# Patient Record
Sex: Male | Born: 1935 | Race: White | Hispanic: No | Marital: Married | Smoking: Former smoker
Health system: Southern US, Community
[De-identification: ages and names within clinical notes are randomized; demographics above are authoritative.]

## PROBLEM LIST (undated history)

## (undated) DIAGNOSIS — M1990 Unspecified osteoarthritis, unspecified site: Secondary | ICD-10-CM

## (undated) DIAGNOSIS — Z923 Personal history of irradiation: Secondary | ICD-10-CM

## (undated) DIAGNOSIS — H02439 Paralytic ptosis unspecified eyelid: Secondary | ICD-10-CM

## (undated) DIAGNOSIS — G510 Bell's palsy: Secondary | ICD-10-CM

## (undated) DIAGNOSIS — I10 Essential (primary) hypertension: Secondary | ICD-10-CM

## (undated) DIAGNOSIS — M542 Cervicalgia: Secondary | ICD-10-CM

## (undated) DIAGNOSIS — IMO0002 Reserved for concepts with insufficient information to code with codable children: Secondary | ICD-10-CM

## (undated) DIAGNOSIS — N189 Chronic kidney disease, unspecified: Secondary | ICD-10-CM

## (undated) HISTORY — PX: OTHER SURGICAL HISTORY: SHX169

---

## 1997-03-19 HISTORY — PX: BACK SURGERY: SHX140

## 1999-01-02 ENCOUNTER — Other Ambulatory Visit: Admission: RE | Admit: 1999-01-02 | Discharge: 1999-01-02 | Payer: Self-pay | Admitting: Urology

## 2012-07-16 ENCOUNTER — Ambulatory Visit: Payer: Self-pay | Admitting: Otolaryngology

## 2012-07-16 LAB — CBC WITH DIFFERENTIAL/PLATELET
Basophil #: 0.1 10*3/uL (ref 0.0–0.1)
Basophil %: 1.1 %
Eosinophil %: 4.1 %
HCT: 41.5 % (ref 40.0–52.0)
Lymphocyte #: 1.8 10*3/uL (ref 1.0–3.6)
Lymphocyte %: 24.3 %
MCH: 32.5 pg (ref 26.0–34.0)
MCHC: 35 g/dL (ref 32.0–36.0)
MCV: 93 fL (ref 80–100)
Monocyte #: 0.6 x10 3/mm (ref 0.2–1.0)
Platelet: 218 10*3/uL (ref 150–440)
RDW: 13.5 % (ref 11.5–14.5)

## 2012-07-16 LAB — BASIC METABOLIC PANEL
Anion Gap: 5 — ABNORMAL LOW (ref 7–16)
Chloride: 106 mmol/L (ref 98–107)
Co2: 29 mmol/L (ref 21–32)
EGFR (African American): >60
EGFR (Non-African Amer.): 57 — ABNORMAL LOW
Osmolality: 283 (ref 275–301)
Potassium: 3.9 mmol/L (ref 3.5–5.1)

## 2012-07-17 ENCOUNTER — Ambulatory Visit: Payer: Self-pay | Admitting: Radiation Oncology

## 2012-07-17 ENCOUNTER — Ambulatory Visit: Payer: Self-pay | Admitting: Otolaryngology

## 2012-07-17 HISTORY — PX: OTHER SURGICAL HISTORY: SHX169

## 2012-07-23 ENCOUNTER — Ambulatory Visit: Payer: Self-pay | Admitting: Radiation Oncology

## 2012-08-05 LAB — CBC CANCER CENTER
Basophil #: 0.1 x10 3/mm (ref 0.0–0.1)
Basophil %: 1.1 %
Eosinophil #: 0.3 x10 3/mm (ref 0.0–0.7)
Eosinophil %: 4.5 %
HCT: 40.9 % (ref 40.0–52.0)
Lymphocyte #: 1.4 x10 3/mm (ref 1.0–3.6)
Lymphocyte %: 21.2 %
MCH: 31.9 pg (ref 26.0–34.0)
MCV: 94 fL (ref 80–100)
Monocyte %: 7.6 %
Neutrophil #: 4.3 x10 3/mm (ref 1.4–6.5)
Neutrophil %: 65.6 %
WBC: 6.6 x10 3/mm (ref 3.8–10.6)

## 2012-08-12 LAB — CBC CANCER CENTER
Basophil %: 1.1 %
HCT: 40.2 % (ref 40.0–52.0)
Lymphocyte #: 1.3 x10 3/mm (ref 1.0–3.6)
Lymphocyte %: 18.3 %
MCHC: 34.6 g/dL (ref 32.0–36.0)
MCV: 93 fL (ref 80–100)
Monocyte #: 0.5 x10 3/mm (ref 0.2–1.0)
Monocyte %: 7.3 %
Neutrophil #: 5 x10 3/mm (ref 1.4–6.5)
Neutrophil %: 69.4 %
Platelet: 230 x10 3/mm (ref 150–440)
RDW: 13.7 % (ref 11.5–14.5)
WBC: 7.2 x10 3/mm (ref 3.8–10.6)

## 2012-08-17 ENCOUNTER — Ambulatory Visit: Payer: Self-pay | Admitting: Radiation Oncology

## 2012-08-19 LAB — CBC CANCER CENTER
Basophil #: 0.1 x10 3/mm (ref 0.0–0.1)
Basophil %: 1.2 %
Eosinophil %: 2.7 %
HCT: 39.9 % — ABNORMAL LOW (ref 40.0–52.0)
HGB: 13.9 g/dL (ref 13.0–18.0)
Lymphocyte %: 17.3 %
MCV: 93 fL (ref 80–100)
Monocyte #: 0.7 x10 3/mm (ref 0.2–1.0)
Neutrophil #: 5.2 x10 3/mm (ref 1.4–6.5)
Platelet: 217 x10 3/mm (ref 150–440)
RBC: 4.29 10*6/uL — ABNORMAL LOW (ref 4.40–5.90)
RDW: 13.7 % (ref 11.5–14.5)
WBC: 7.5 x10 3/mm (ref 3.8–10.6)

## 2012-08-26 LAB — CBC CANCER CENTER
Basophil %: 0.3 %
Eosinophil #: 0.1 x10 3/mm (ref 0.0–0.7)
Lymphocyte #: 1.2 x10 3/mm (ref 1.0–3.6)
Lymphocyte %: 12.1 %
MCH: 32.2 pg (ref 26.0–34.0)
MCV: 93 fL (ref 80–100)
Monocyte #: 0.7 x10 3/mm (ref 0.2–1.0)
Monocyte %: 7.2 %
Neutrophil %: 79.7 %
Platelet: 250 x10 3/mm (ref 150–440)
RBC: 4.33 10*6/uL — ABNORMAL LOW (ref 4.40–5.90)
RDW: 13.4 % (ref 11.5–14.5)

## 2012-09-02 ENCOUNTER — Ambulatory Visit: Payer: Self-pay | Admitting: Otolaryngology

## 2012-09-16 ENCOUNTER — Ambulatory Visit: Payer: Self-pay | Admitting: Radiation Oncology

## 2012-09-25 ENCOUNTER — Ambulatory Visit: Payer: Self-pay | Admitting: Otolaryngology

## 2012-09-25 LAB — CREATININE, SERUM
Creatinine: 1.26 mg/dL (ref 0.60–1.30)
EGFR (African American): >60
EGFR (Non-African Amer.): 54 — ABNORMAL LOW

## 2012-11-20 ENCOUNTER — Ambulatory Visit: Payer: Self-pay | Admitting: Otolaryngology

## 2012-12-29 ENCOUNTER — Ambulatory Visit: Payer: Self-pay | Admitting: Otolaryngology

## 2013-03-17 ENCOUNTER — Ambulatory Visit: Payer: Self-pay | Admitting: Physical Medicine and Rehabilitation

## 2013-04-22 ENCOUNTER — Ambulatory Visit: Payer: Self-pay | Admitting: Otolaryngology

## 2013-04-23 ENCOUNTER — Ambulatory Visit: Payer: Self-pay | Admitting: Otolaryngology

## 2013-04-23 DIAGNOSIS — IMO0002 Reserved for concepts with insufficient information to code with codable children: Secondary | ICD-10-CM | POA: Insufficient documentation

## 2013-04-23 HISTORY — DX: Reserved for concepts with insufficient information to code with codable children: IMO0002

## 2013-04-24 LAB — PATHOLOGY REPORT

## 2013-04-27 ENCOUNTER — Ambulatory Visit: Payer: Self-pay | Admitting: Oncology

## 2013-05-04 ENCOUNTER — Ambulatory Visit: Payer: Self-pay | Admitting: Oncology

## 2013-05-11 ENCOUNTER — Telehealth: Payer: Self-pay | Admitting: *Deleted

## 2013-05-11 ENCOUNTER — Encounter: Payer: Self-pay | Admitting: Radiation Oncology

## 2013-05-11 DIAGNOSIS — Z923 Personal history of irradiation: Secondary | ICD-10-CM | POA: Insufficient documentation

## 2013-05-11 NOTE — Progress Notes (Signed)
Head and Neck Cancer Location of Tumor / Histology: right lower neck mass  Patient presented 7 months ago with symptoms of: pain in right ear, disruptive at night  Biopsies of right lower neck mass (if applicable) revealed: squamous cell carcinoma  Nutrition Status:  Weight changes: reports a 15-20 lb weight loss over the last 3-4 months  Swallowing status: Report difficulty swallowing related to dry mouth  Plans, if any, for PEG tube:   Tobacco/Marijuana/Snuff/ETOH use: Former smoker but, quit around 1988  Past/Anticipated interventions by otolaryngology, if any:   Past/Anticipated interventions by medical oncology, if any:   Referrals yet, to any of the following?  Social Work? no  Dentistry? no  Swallowing therapy? no  Nutrition? no  Med/Onc? no  PEG placement? no  SAFETY ISSUES:  Prior radiation? YES  07/28/12- 09/03/12, right cheek, 5000 cGy 25 fractions, [place], Dr Noreene Filbert  Pacemaker/ICD?   Possible current pregnancy? na  Is the patient on methotrexate? no  Current Complaints / other details:  Reports an ache behind his right ear managed with gabapentin and hydrocodone, reports dry mouth, decreased appetite, denies ringing in the ears, or sleeping well with assistance of pain medication

## 2013-05-11 NOTE — Telephone Encounter (Signed)
Called pt at home to introduce myself as the nurse navigator that works with Dr. Valere Dross.  LVM with indication that I would be joining him during his appt with Dr. Valere Dross tomorrow and would tell him more  about my role as a member of the Care Team when we meet.   Gayleen Orem, RN, BSN, [facility] Head & Neck [facility] Navigator [phone]

## 2013-05-12 ENCOUNTER — Encounter: Payer: Self-pay | Admitting: Radiation Oncology

## 2013-05-12 ENCOUNTER — Encounter: Payer: Self-pay | Admitting: *Deleted

## 2013-05-12 ENCOUNTER — Ambulatory Visit
Admission: RE | Admit: 2013-05-12 | Discharge: 2013-05-12 | Disposition: A | Discharge status: Home / Self Care | Payer: Medicare Other | Source: Ambulatory Visit | Attending: Radiation Oncology | Admitting: Radiation Oncology

## 2013-05-12 VITALS — BP 128/79 | HR 75 | Temp 97.5°F | Resp 16 | Ht 69.0 in | Wt 199.4 lb

## 2013-05-12 DIAGNOSIS — G510 Bell's palsy: Secondary | ICD-10-CM | POA: Insufficient documentation

## 2013-05-12 DIAGNOSIS — N189 Chronic kidney disease, unspecified: Secondary | ICD-10-CM | POA: Insufficient documentation

## 2013-05-12 DIAGNOSIS — C4442 Squamous cell carcinoma of skin of scalp and neck: Secondary | ICD-10-CM | POA: Insufficient documentation

## 2013-05-12 DIAGNOSIS — Z79899 Other long term (current) drug therapy: Secondary | ICD-10-CM | POA: Insufficient documentation

## 2013-05-12 DIAGNOSIS — Z87891 Personal history of nicotine dependence: Secondary | ICD-10-CM | POA: Insufficient documentation

## 2013-05-12 DIAGNOSIS — IMO0002 Reserved for concepts with insufficient information to code with codable children: Secondary | ICD-10-CM

## 2013-05-12 DIAGNOSIS — Z923 Personal history of irradiation: Secondary | ICD-10-CM | POA: Insufficient documentation

## 2013-05-12 DIAGNOSIS — I129 Hypertensive chronic kidney disease with stage 1 through stage 4 chronic kidney disease, or unspecified chronic kidney disease: Secondary | ICD-10-CM | POA: Insufficient documentation

## 2013-05-12 HISTORY — DX: Personal history of irradiation: Z92.3

## 2013-05-12 HISTORY — DX: Chronic kidney disease, unspecified: N18.9

## 2013-05-12 HISTORY — DX: Reserved for concepts with insufficient information to code with codable children: IMO0002

## 2013-05-12 HISTORY — DX: Bell's palsy: G51.0

## 2013-05-12 HISTORY — DX: Paralytic ptosis unspecified eyelid: H02.439

## 2013-05-12 HISTORY — DX: Cervicalgia: M54.2

## 2013-05-12 HISTORY — DX: Unspecified osteoarthritis, unspecified site: M19.90

## 2013-05-12 HISTORY — DX: Essential (primary) hypertension: I10

## 2013-05-12 NOTE — Progress Notes (Addendum)
[facility] NEW PATIENT EVALUATION  Name: Wyatt [name] MRN: [id number]  Date:   05/12/2013           DOB: March 18, 1931  Status: outpatient   CC: No primary provider on file.  Norva Pavlov, MD,  Dr. Delight Hoh [phone]), Dr. Noreene Filbert, Dr. Jorja Loa   REFERRING PHYSICIAN: Norva Pavlov, MD   DIAGNOSIS: Recurrent squamous cell carcinoma, initial skin primary  HISTORY OF PRESENT ILLNESS:  Wyatt [name] is a 80 y.o. male who is seen today through the courtesy of Dr. Janalee Dane for consideration of palliative radiation therapy in the management of his recurrent squamous cell carcinoma the skin involving the right neck and facial nerve. His complete medical records are not available the time this dictation. He is a poor historian. He believes that he had a skin cancer excised along the right jaw region by Dr. Sarajane Jews early last year. On followup examination he was felt to have for fullness along the right submandibular region and was referred to Dr. Carlis Abbott. A needle aspiration biopsy was negative. A CT scan in April 2014 showed a 1.1 cm subcutaneous nodule anterior and inferior to the inferior portion of the right parotid gland. The patient did agree to a wide local excision. He was found to have a 1.5 cm well-differentiated keratinizing squamous cell carcinoma with a close (2 mm) margin. There was no LV I. One level II lymph node was examined and there was no evidence for metastatic disease. In view of his close margins he underwent electron beam radiation therapy with Dr. Baruch Gouty in [place]. His summary note indicates that he receive 5000 cGy in 25 sessions with 15 MEV electrons. Of note is that 75% of the way through his radiation therapy he developed right facial nerve paralysis and Dr. Carlis Abbott was concerned about facial nerve involvement. The patient was offered surgery but he declined. Subsequently, he developed a mass along the right  level IV region which was biopsied and found to be diagnostic for squamous cell carcinoma. A PET scan at [facility] on 05/04/2013 showed a 1.6 cm right level IV node with SUV of 7.6. There was also a left paraspinal muscle lesion along the left side of C3-C4 (but the body of the report stated L3-L4 which I believe is a typo). Of note is that he had a right-sided epidural cervical spine injection for arthritis 3 weeks ago with no  improvement of his neck pain. He had a similar injection in the past with favorable results. He was seen at [facility] where he apparently had a recent MRI scan apparently suggesting facial nerve involvement. This report and scan are not available the time of this dictation. He was seen by Dr. Carlis Abbott last week he felt that he may be a candidate for palliative radiation therapy. The patient wants to be treated closer to home here in [place]. We spoke to one another yesterday. Dr. Delight Hoh saw the patient and suggests concomitant weekly cisplatin/carboplatin or Erbitux along with radiation therapy. The patient takes hydrocodone and also gabapentin for right-sided neck pain. He has lost approximately 15-20 pounds over the past 4 months and he attributes this to hydrocodone with a decrease in his appetite. The patient plans on getting married on March 21 and would like to start his radiation therapy the week of March 23.  PREVIOUS RADIATION THERAPY: Status post 5000 cGy in 25 sessions completed on 09/03/2012 to the "  right cheek "   PAST MEDICAL HISTORY:  has a past medical history of Bell's palsy; Hypertension; Chronic kidney disease; Squamous cell carcinoma (04/23/13); Arthritis; Cancer (04/23/13); Paralytic ptosis; Cervicalgia; and radiation therapy (07/28/12- 09/03/12).     PAST SURGICAL HISTORY:  Past Surgical History  Procedure Laterality Date  . Eyelid surgery Right     platinum weight placement/removal  . Resection right cheek mass Right 07/17/2012  .  Back surgery  1999     FAMILY HISTORY: His father died of old age at 50. His mother died in her mid to late 62s, unknown cause.  SOCIAL HISTORY:  reports that he quit smoking about 27 years ago. His smoking use included Cigarettes. He smoked 0.50 packs per day. He has never used smokeless tobacco. He reports that he drinks alcohol. He reports that he does not use illicit drugs. widowed, one adopted son. He plans on getting married on March 21. 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 He is been retired for 6-7 years.   ALLERGIES: Review of patient's allergies indicates no known allergies.   MEDICATIONS:  Current Outpatient Prescriptions  Medication Sig Dispense Refill  . donepezil (ARICEPT) 10 MG tablet Take 10 mg by mouth at bedtime.      Marland Kitchen ezetimibe-simvastatin (VYTORIN) 10-10 MG per tablet Take 1 tablet by mouth at bedtime.      Marland Kitchen HYDROcodone-acetaminophen (NORCO/VICODIN) 5-325 MG per tablet Take 1 tablet by mouth every 6 (six) hours as needed for moderate pain.      . hydroquinone 4 % cream Apply topically 2 (two) times daily.      . lansoprazole (PREVACID) 15 MG capsule Take 15 mg by mouth daily at 12 noon.      . levETIRAcetam (KEPPRA) 1000 MG tablet Take 1,000 mg by mouth daily.      . memantine (NAMENDA) 10 MG tablet Take 10 mg by mouth daily.      . metoprolol succinate (TOPROL-XL) 100 MG 24 hr tablet Take 100 mg by mouth daily. Take with or immediately following a meal.      . aspirin 81 MG tablet Take 81 mg by mouth daily.      Marland Kitchen doxycycline (VIBRAMYCIN) 100 MG capsule Take 100 mg by mouth daily.      . tamsulosin (FLOMAX) 0.4 MG CAPS capsule Take 0.4 mg by mouth.       No current facility-administered medications for this encounter.     REVIEW OF SYSTEMS:  Pertinent items are noted in HPI.    PHYSICAL EXAM:  height is 5\' 9"  (1.753 m) and weight is 199 lb 6.4 oz (90.447 kg). His oral temperature is 97.5 F (36.4 C). His blood pressure is 128/79  and his pulse is 75. His respiration is 16 and oxygen saturation is 100%.   Alert and oriented 80 year-old white male with obvious right facial weakness appearing his stated age. Head and neck examination: There is right facial paralysis. Oral cavity and oropharynx are unremarkable to inspection. He has numerous dental restorations. His mouth is moist. Neck: There is a surgical scar standing from the right posttreatment region and down to the right lower neck. There is hair loss extending superiorly to the angle of the jaw line and extending inferiorly to portions of level II and level III. There is a palpable subcutaneous/nodal mass along the right lower neck (level IV) which is fixed to the overlying skin. There is slight nodularity to his right neck  scar but no definite recurrence elsewhere within the neck. Cranial nerve examination remarkable for right peripheral seventh nerve paralysis.   LABORATORY DATA:  No results found for this basename: WBC, HGB, HCT, MCV, PLT   No results found for this basename: NA, K, CL, CO2   No results found for this basename: ALT, AST, GGT, ALKPHOS, BILITOT      IMPRESSION: Recurrent squamous cell carcinoma of the skin involving the lower right neck, and presumably the right facial nerve/temporal region. I need to review all of his outside studies including his recent MRI scan from [facility]. I need to review his radiation therapy fields, and I spoke with Dr. Baruch Gouty this morning. With respect to his PET scan findings from February 16, I wonder if we are seeing uptake related to his recent epidural injection, although his injection was on the right side of the cervical spine. I explained to the patient and his fiance that we can offer him palliative radiation therapy from the right temporal region down through the right lower neck, but reduce the dose to the previously irradiated electron beam volume prescribed by Dr. Baruch Gouty. Records are being sent this  afternoon. We discussed the potential acute and late toxicities of radiation therapy, but will need to review his records to give him more specifics. I would also like for Mr. Chrissie Noa to visit Dr. Enrique Sack of dental oncology for scatter protection devices, and to assess his right-sided dentition. Afterwards, I will get him back here for simulation/treatment planning in early March and expect to begin his radiation therapy along with sensitizing Erbitux through Dr. Delight Hoh the week of March 23.   PLAN: As discussed above.  I spent 60 minutes minutes face to face with the patient and more than 50% of that time was spent in counseling and/or coordination of care.

## 2013-05-12 NOTE — Progress Notes (Signed)
See progress note under physician encounter. 

## 2013-05-13 ENCOUNTER — Encounter (HOSPITAL_COMMUNITY): Payer: Self-pay | Admitting: Dentistry

## 2013-05-13 ENCOUNTER — Ambulatory Visit (HOSPITAL_COMMUNITY): Payer: Self-pay | Admitting: Dentistry

## 2013-05-13 VITALS — BP 146/85 | HR 58 | Temp 97.6°F

## 2013-05-13 DIAGNOSIS — C4490 Unspecified malignant neoplasm of skin, unspecified: Secondary | ICD-10-CM

## 2013-05-13 DIAGNOSIS — C779 Secondary and unspecified malignant neoplasm of lymph node, unspecified: Secondary | ICD-10-CM

## 2013-05-13 DIAGNOSIS — K117 Disturbances of salivary secretion: Secondary | ICD-10-CM

## 2013-05-13 DIAGNOSIS — C50919 Malignant neoplasm of unspecified site of unspecified female breast: Secondary | ICD-10-CM

## 2013-05-13 DIAGNOSIS — C801 Malignant (primary) neoplasm, unspecified: Secondary | ICD-10-CM

## 2013-05-13 DIAGNOSIS — IMO0002 Reserved for concepts with insufficient information to code with codable children: Secondary | ICD-10-CM

## 2013-05-13 DIAGNOSIS — M270 Developmental disorders of jaws: Secondary | ICD-10-CM

## 2013-05-13 DIAGNOSIS — K0530 Chronic periodontitis, unspecified: Secondary | ICD-10-CM

## 2013-05-13 DIAGNOSIS — K036 Deposits [accretions] on teeth: Secondary | ICD-10-CM

## 2013-05-13 DIAGNOSIS — Z0189 Encounter for other specified special examinations: Secondary | ICD-10-CM

## 2013-05-13 DIAGNOSIS — R682 Dry mouth, unspecified: Secondary | ICD-10-CM

## 2013-05-13 DIAGNOSIS — K08409 Partial loss of teeth, unspecified cause, unspecified class: Secondary | ICD-10-CM

## 2013-05-13 MED ORDER — SODIUM FLUORIDE 1.1 % DT GEL: DENTAL | Status: AC

## 2013-05-13 NOTE — Patient Instructions (Signed)

## 2013-05-13 NOTE — Progress Notes (Signed)
DENTAL CONSULTATION  Date of Consultation:  05/13/2013 Patient Name:   Wyatt [name] Date of Birth:   1930-12-15 Medical Record Number: [id number]  VITALS: BP 146/85  Pulse 58  Temp(Src) 97.6 F (36.4 C) (Oral)  CHIEF COMPLAINT: Patient was referred by Dr. Arloa Koh for a preradiation therapy dental protocol examination.  HPI: Wyatt [name] is an 81 year old male recently diagnosed with recurrent squamous cell carcinoma involving the right neck and facial nerve.  This cancer had an initial skin cancer primary.  The patient with anticipated Erbitux chemotherapy with Dr. Grayland Ormond and radiation therapy with Dr. Valere Dross.  The patient is now seen as part of a medically necessary pre-chemoradiation therapy dental protocol examination.  The patient currently denies acute toothache, swellings, or abscesses. The patient was last seen for an exam and cleaning approximately 3 months ago in November of 2014. This was with Dr. Roxanne Gates Maner in [place], [place]. The patient is usually seen 3-4 times a year for periodontal therapy. Patient denies having any unmet dental needs at this time.  PROBLEM LIST: Patient Active Problem List   Diagnosis Date Noted  . Squamous cell carcinoma 04/23/2013    Priority: High  . Hx of radiation therapy     PMH: Past Medical History  Diagnosis Date  . Bell's palsy   . Hypertension   . Chronic kidney disease   . Squamous cell carcinoma 04/23/13    right lower neck mass  . Arthritis   . Paralytic ptosis   . Cervicalgia   . Hx of radiation therapy 07/28/12- 09/03/12    5000 cGy right cheek, 25 fractions    PSH: Past Surgical History  Procedure Laterality Date  . Eyelid surgery Right     platinum weight placement/removal  . Resection right cheek mass Right 07/17/2012  . Back surgery  1999    ALLERGIES: No Known Allergies  MEDICATIONS: Current Outpatient Prescriptions  Medication Sig Dispense Refill  . donepezil (ARICEPT) 10 MG tablet Take 10 mg by  mouth at bedtime.      Marland Kitchen ezetimibe-simvastatin (VYTORIN) 10-10 MG per tablet Take 1 tablet by mouth at bedtime.      Marland Kitchen HYDROcodone-acetaminophen (NORCO/VICODIN) 5-325 MG per tablet Take 1 tablet by mouth every 6 (six) hours as needed for moderate pain.      . hydroquinone 4 % cream Apply topically 2 (two) times daily.      . lansoprazole (PREVACID) 15 MG capsule Take 15 mg by mouth daily at 12 noon.      . levETIRAcetam (KEPPRA) 1000 MG tablet Take 1,000 mg by mouth daily.      . memantine (NAMENDA) 10 MG tablet Take 10 mg by mouth daily.      . metoprolol succinate (TOPROL-XL) 100 MG 24 hr tablet Take 100 mg by mouth daily. Take with or immediately following a meal.      . aspirin 81 MG tablet Take 81 mg by mouth daily.      Marland Kitchen doxycycline (VIBRAMYCIN) 100 MG capsule Take 100 mg by mouth daily.      . tamsulosin (FLOMAX) 0.4 MG CAPS capsule Take 0.4 mg by mouth.       No current facility-administered medications for this visit.    LABS: No results found for this basename: WBC, HGB, HCT, MCV, PLT   No results found for this basename: na, k, cl, co2, glucose, bun, creatinine, calcium, gfrnonaa, gfraa   No results found for this basename: INR, PROTIME   No  results found for this basename: PTT    SOCIAL HISTORY: History   Social History  . Marital Status: Married    Spouse Name: N/A    Number of Children: N/A  . Years of Education: N/A   Occupational History  .      Retired-real estate, Charity fundraiser   Social History Main Topics  . Smoking status: Former Smoker -- 0.50 packs/day    Types: Cigarettes    Quit date: 03/19/1986  . Smokeless tobacco: Never Used  . Alcohol Use: Yes     Comment: social drinker on the weekend 4-5 drinks per weekend   . Drug Use: No  . Sexual Activity: Not on file   Other Topics Concern  . Not on file   Social History Narrative   The patient is widowed. He had 1 adopted son. Patient plans on getting married on 06/06/2013.   [name]).   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 He has been retired for 6-7 years.   The patient previously smoked approximately one half pack per day. Patient quit smoking approximately 27 years ago. (1988)   Patient has never used smokeless tobacco.   Patient does use alcohol during the weekend.   The patient denies use of IV drugs.         FAMILY HISTORY: Mother and father are both deceased. Mother passed away in her late 46s of an unknown cause. Father died at the age of 55 from old age.  REVIEW OF SYSTEMS: Reviewed with patient and included in dental record.  DENTAL HISTORY: CHIEF COMPLAINT: Patient was referred by Dr. Arloa Koh for a preradiation therapy dental protocol examination.  HPI: Wyatt [name] is an 80 year old male recently diagnosed with recurrent squamous cell carcinoma involving the right neck and facial nerve.  This cancer had an initial skin cancer primary.  The patient with anticipated Erbitux chemotherapy with Dr. Grayland Ormond and radiation therapy with Dr. Valere Dross.  The patient is now seen as part of a medically necessary pre-chemoradiation therapy dental protocol examination.  The patient currently denies acute toothache, swellings, or abscesses. The patient was last seen for an exam and cleaning approximately 3 months ago in November of 2014. This was with Dr. Roxanne Gates Maner in [place], [place]. The patient is usually seen 3-4 times a year for periodontal therapy. Patient denies having any unmet dental needs at this time.  DENTAL EXAMINATION: GENERAL: The patient is a well-developed, well-nourished male in no acute distress. HEAD AND NECK:The patient has right facial paralysis with obvious facial droop. There is a surgical scar involving the right neck. There is a neck mass involving the lower right neck (level IV). The patient denies acute TMJ symptoms. The mandible deviates from right to left on maximum opening. INTRAORAL EXAM: The patient has   xerostomia. The patient has bilateral mandibular lingual tori. The patient has a deep palatal vault. The patient has a dry, fissured tongue. There is no evidence of intraoral abscess formation. DENTITION: The patient is missing tooth numbers 1, 3, 12, 16, 17, 25, and 32. The space between tooth numbers 24/26 has been closed.  PERIODONTAL: The patient has chronic periodontitis with plaque accumulations, selective areas of gingival recession, and no significant tooth mobility. Oral hygiene instruction was provided.  DENTAL CARIES/SUBOPTIMAL RESTORATIONS: There are no obvious dental caries or suboptimal dental restorations.  ENDODONTIC: The patient currently denies acute pulpitis symptoms. I do not see any evidence of periapical pathology.  The patient has a previous root canal therapy associated with tooth #15 treated with silver points.  CROWN AND BRIDGE: There are multiple crowns and a bridge restoration noted.  There is fractured porcelain associated with the PFM on tooth #30. PROSTHODONTIC: There are no partial dentures. OCCLUSION: The patient has a poor occlusal scheme but a stable occlusion at this time.   RADIOGRAPHIC INTERPRETATION: An orthopantogram was unable to be obtained today due to inability to bypass shoulders of the patient during the radiographic process. A full series of dental radiographs was obtained. There are multiple missing teeth. There is supra-eruption and drifting of the unopposed teeth into the edentulous areas. There is incipient to moderate bone loss noted. There are no obvious periapical radiolucencies. There is a root canal therapy associated with tooth #15 with silver points. There are multiple dental restorations noted to include crowns, amalgams, resins, and a Bridge.   ASSESSMENTS: 1. Recurrent squamous cell carcinoma of the right neck with probable skin primary. 2. Pre-chemoradiation therapy dental protocol examination 3.  Chronic periodontitis with bone loss 4.  Selective areas of gingival recession 5. No significant tooth mobility 6. Plaque accumulations 7. No obvious dental caries are noted. 8. Fractured porcelain on the crown of tooth #30 9. Multiple missing teeth 10. Supra-eruption and drifting of the unopposed teeth into the edentulous areas 11. Poor occlusal scheme but a stable occlusion 12. Xerostomia  13. Dry, fissured tongue 14. Bilateral mandibular lingual tori 15. Deep palatal vault  PLAN/RECOMMENDATIONS: 1. I discussed the risks, benefits, and complications of various treatment options with the patient  and fiance in relationship to his medical and dental conditions, anticipated Erbitux and radiation therapy, and anticipated side effects to include xerostomia, radiation caries, mucositis, taste changes, gum and jawbone changes, and risk for infection and osteoradionecrosis. We discussed various treatment options to include no treatment, multiple extractions with alveoloplasty, pre-prosthetic surgery as indicated, periodontal therapy, dental restorations, root canal therapy, crown and bridge therapy, implant therapy, and replacement of missing teeth as indicated. The patient is adamant about not having dental extractions at this time. The patient currently wishes to proceed with impressions today for the fabrication of fluoride trays and scatter protection devices. A prescription for FluoroSHIELD was sent to the [facility] pharmacy on [facility].   2. Discussion of findings with medical team and coordination of future medical and dental care as needed.  I spent 60 minutes face to face with patient and more than 50% of time was spent in counseling and /or coordination of care.   Lenn Cal, DDS

## 2013-05-14 NOTE — Progress Notes (Addendum)
Met with patient and his wife during initial consult with Dr. Valere Dross.  Introduced myself as Designer, television/film set, explained my role as a member of the Care Team, provided contact information, encouraged them to contact me with questions/concerns as treatments/procedures begin.  They verbalized understanding.    Provided verbal and written information to patient re: "Minimally Invasive Head and Neck and Intracranial Radiotherapy Immobilization" study, showed examples of full-faced and open-faced masks, answered questions. Patient agreed to participate in study, signed documents, I gave him originals of consent/HIPAA forms, delivered signed duplicate signature pages to Tiburcio Pea, study director, notified Dr. Valere Dross of patient's participation.  Provided patient and his wife a tour of SIM and tomo areas, explained treatment and arrival procedures.  They verbalized understanding.  Initiating navigation as L1 patient (new) with this encounter.  Gayleen Orem, RN, BSN, [facility] Neck [facility] Navigator [phone]

## 2013-05-17 ENCOUNTER — Ambulatory Visit: Payer: Self-pay | Admitting: Oncology

## 2013-05-18 ENCOUNTER — Encounter: Payer: Self-pay | Admitting: Radiation Oncology

## 2013-05-18 NOTE — Progress Notes (Signed)
CC: Dr. Janalee Dane, Dr. Delight Hoh (fax [phone])  I received the patient's medical records from [facility]. He was seen in consultation by Dr. Lanny Cramp of otolaryngology on 02/18/2013. Dr. Carlis Abbott performed a level II and III neck dissection on 07/17/2012. Per his report, he peeled tumor off the marginal mandibular nerve. He gives a history of right V2 hypesthesia. His MRI scan on 02/18/2013 showed asymmetric thickening and enhancement of the V3 segment of the right V cranial nerve extending retrograde into the right foramen ovale but no definite extension into the cavernous sinus or Meckel's cave. There was also asymmetric enhancement of the right jugular ganglion and the labyrinthine portion of the right seventh nerve with asymmetric thickening and enhancement of the tympanic and mastoid segments of the right seventh nerve as well.  I have reviewed Dr. Olena Leatherwood radiation therapy fields and doses, and we should have no difficulty giving him radiation therapy from his skull base extending inferiorly to include the lower right neck, however we will need to decrease the dose of radiation therapy just inferior to the right mandible/upper neck where there is no obvious tumor at this time. He does not want to begin his radiation therapy until after his wedding on March 21. I will have him come in next week for his simulation/treatment planning and plan to begin his radiation therapy on March 23. I'll speak with Dr. Delight Hoh to discuss systemic/radiosensitization therapy. Lastly, he has been given clearance by our dental oncologist, Dr. Jorja Loa.

## 2013-05-19 ENCOUNTER — Telehealth: Payer: Self-pay | Admitting: *Deleted

## 2013-05-19 NOTE — Telephone Encounter (Signed)
Called pt and informed him of Dr Charlton Amor response. Pt states "I will certainly do that", re: call his PCP or Dr Carlis Abbott. Pt stated "Thank you for your call back. I sure do appreciate the good care you all are giving me there." This RN thanked the patient for his kindness. Pt stated "I look forward to seeing you again soon."

## 2013-05-19 NOTE — Telephone Encounter (Signed)
This does not sound like complication from current cancer. He should see primary care or Dr. Carlis Abbott. I will be getting him schedule for CT sim next week. RM

## 2013-05-19 NOTE — Telephone Encounter (Signed)
Received vm from pt stating he was on his way home from [place] where he lives and had " a bad case of acid reflux". He states "it was hard to get anything up or down, and I was short of breath for 30 minutes." Pt inquiring who to see or what to do. Called pt for details. He states that he was trying to drink soda, and "it wouldn't go down or come back up". Pt states he "coughed and coughed" and became SOB, but it resolved in " a few minutes". Pt states he he is unsure who to discuss this issue with. Informed him will route his concerns to Dr Valere Dross and call him back w/response. Pt verbalized understanding and verbalized appreciation for a quick call back from this RN.  Pt was new consultation w/Dr Valere Dross on 05/12/13, has no further rad onc appointment at this time. Pt has seen Dr Enrique Sack for dental evaluation as well.

## 2013-05-20 ENCOUNTER — Other Ambulatory Visit: Payer: Self-pay | Admitting: Radiation Oncology

## 2013-05-20 ENCOUNTER — Inpatient Hospital Stay
Admission: RE | Admit: 2013-05-20 | Discharge: 2013-05-20 | Disposition: A | Discharge status: Home / Self Care | Payer: Self-pay | Source: Ambulatory Visit | Attending: Radiation Oncology | Admitting: Radiation Oncology

## 2013-05-20 DIAGNOSIS — C4491 Basal cell carcinoma of skin, unspecified: Secondary | ICD-10-CM

## 2013-05-22 ENCOUNTER — Encounter (HOSPITAL_COMMUNITY): Payer: Self-pay | Admitting: Dentistry

## 2013-05-25 ENCOUNTER — Ambulatory Visit (HOSPITAL_COMMUNITY): Payer: Self-pay | Admitting: Dentistry

## 2013-05-25 ENCOUNTER — Ambulatory Visit
Admission: RE | Admit: 2013-05-25 | Discharge: 2013-05-25 | Disposition: A | Discharge status: Home / Self Care | Payer: Medicare Other | Source: Ambulatory Visit | Attending: Radiation Oncology | Admitting: Radiation Oncology

## 2013-05-25 ENCOUNTER — Encounter (HOSPITAL_COMMUNITY): Payer: Self-pay | Admitting: Dentistry

## 2013-05-25 ENCOUNTER — Encounter: Payer: Self-pay | Admitting: *Deleted

## 2013-05-25 VITALS — BP 95/65 | HR 78 | Temp 97.6°F

## 2013-05-25 DIAGNOSIS — Z0189 Encounter for other specified special examinations: Secondary | ICD-10-CM

## 2013-05-25 DIAGNOSIS — C760 Malignant neoplasm of head, face and neck: Secondary | ICD-10-CM

## 2013-05-25 DIAGNOSIS — Z510 Encounter for antineoplastic radiation therapy: Secondary | ICD-10-CM | POA: Insufficient documentation

## 2013-05-25 DIAGNOSIS — Z463 Encounter for fitting and adjustment of dental prosthetic device: Secondary | ICD-10-CM

## 2013-05-25 DIAGNOSIS — C7949 Secondary malignant neoplasm of other parts of nervous system: Secondary | ICD-10-CM | POA: Insufficient documentation

## 2013-05-25 DIAGNOSIS — IMO0002 Reserved for concepts with insufficient information to code with codable children: Secondary | ICD-10-CM

## 2013-05-25 DIAGNOSIS — C801 Malignant (primary) neoplasm, unspecified: Secondary | ICD-10-CM

## 2013-05-25 DIAGNOSIS — C4442 Squamous cell carcinoma of skin of scalp and neck: Secondary | ICD-10-CM | POA: Insufficient documentation

## 2013-05-25 DIAGNOSIS — C792 Secondary malignant neoplasm of skin: Secondary | ICD-10-CM | POA: Insufficient documentation

## 2013-05-25 NOTE — Patient Instructions (Addendum)

## 2013-05-25 NOTE — Progress Notes (Signed)
05/25/2013  Patient:            Wyatt [name] Date of Birth:  01-Jan-1931 MRN:                [id number]  BP 95/65  Pulse 78  Temp(Src) 97.6 F (36.4 C) (Oral)  Wyatt [name] now presents for insertion of upper and lower fluoride trays and scatter protection devices.  PROCEDURE: Appliances were tried in and adjusted as needed. [place] Trismus device was fabricated 40 mm using 25 sticks. Postop instructions were provided and a written and verbal format concerning the use and care of appliances. All questions were answered. Patient to return to clinic for periodic oral examination during radiation therapy. Patient to call for this appointment once he knows when his radiation treatments will start. It was also suggested that he obtain another dental cleaning by his primary Dentist prior to start of his radiation therapy, [place].  Patient to call if questions or problems arise before then.  Lenn Cal, DDS

## 2013-05-25 NOTE — Progress Notes (Signed)
Met with patient and his wife during [facility].  Spoke at length with wife during procedure, answered her questions about SEs and interventions available.  After SIM, showed patient and his wife Tomo area, introduced them to RTTs, explained procedure for tmt preparation.  Continuing navigation as L1 patient (new patient).  Gayleen Orem, RN, BSN, [facility] Head & Neck Oncology Navigator [phone]

## 2013-05-26 ENCOUNTER — Ambulatory Visit
Admission: RE | Admit: 2013-05-26 | Discharge: 2013-05-26 | Disposition: A | Discharge status: Home / Self Care | Payer: Medicare Other | Source: Ambulatory Visit | Attending: Radiation Oncology | Admitting: Radiation Oncology

## 2013-05-27 ENCOUNTER — Encounter: Payer: Self-pay | Admitting: *Deleted

## 2013-05-27 NOTE — Progress Notes (Signed)
Discussed patient during weekly Multidisciplinary Support Team meeting (Barb Neff, RD; Lauren Mullis, LCSW; Alexis Smith, Chaplain; myself).  Rick Diehl, RN, BSN, CHPN Head & Neck Oncology Navigator [phone]  

## 2013-05-27 NOTE — Progress Notes (Signed)
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 His gross disease along the neck was outlined at 0.5 cm custom bolus was applied to increase the dose to his skin. A custom head cast on an as frame was constructed for immobilization. He was scanned. The CT data set was fused with the MRI scan for contouring of his involved right fifth and seventh involved cranial nerve segments. He was difficult to determine the exact volume treated by his previous electron beam radiation therapy, so he returned on March 10 and a wire was placed guided by photographs and alopecia marking of his skin for his electron beam treatment. The CT data set was fused with the planning CT data set. I contoured his electron beam field and labeled this PTV 5610 so as to limit the dose to this region to 5610 cGy (the equivalent of 5200 cGy if one prescribed 200 cGy a day). I also contoured PTV 6435 to his gross disease PTV and high suspicion for gross disease along his right neck. This volume did not include the electron beam volume of PTV5610.  I then contoured PTV 5280 (the equivalent of 4400 cGy prescribed at 200 cGy day) into his adjacent skull base and adjacent brainstem. The patient will be treated with sensitizing Erbitux which can be expected to increase his treatment-related toxicity, and in particular mucositis. We'll need to monitor his skin and mucosa closely. Avoidance structures are contoured by dosimetry and will be reviewed in detail. He'll be treated with IMRT Tomotherapy and undergo daily image guidance.

## 2013-05-29 ENCOUNTER — Ambulatory Visit: Payer: Self-pay | Admitting: Otolaryngology

## 2013-06-02 ENCOUNTER — Encounter: Payer: Self-pay | Admitting: Radiation Oncology

## 2013-06-02 NOTE — Progress Notes (Signed)
Chart note: Dr. Carlis Abbott called me this afternoon to give me an update on his progress. He continues to have difficulty with swallowing and Dr. Carlis Abbott feels that he has involvement of his right 10th nerve. This is being covered in his current chemotherapy planned. No modifications will need to be made.

## 2013-06-02 NOTE — Progress Notes (Signed)
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 IMRT was chosen to decrease the risk for both acute and late toxicity of the oral cavity and left parotid gland. Dose volume histograms were obtained for the target structures including his high-risk disease, moderate risk disease and also low-risk disease. The patient required a fusion of his MRI scan to his CT simulation scan along with a repeat CT simulation to outline his previously irradiated treatment in [facility]. This required extensive labor intensive planning constituting a special treatment procedure noting overlap with his previous treatment to the right neck. Dose volume histograms were obtained for the avoidance structures, and we met our departmental guidelines with the exception of the cochlea which was adjacent to gross disease. Please see prescription for dose parameters. He'll undergo helical IMRT Tomotherapy with 6 MV photons.

## 2013-06-03 ENCOUNTER — Observation Stay: Payer: Self-pay | Admitting: Internal Medicine

## 2013-06-03 LAB — COMPREHENSIVE METABOLIC PANEL
Albumin: 4.1 g/dL (ref 3.4–5.0)
Alkaline Phosphatase: 129 U/L — ABNORMAL HIGH
Anion Gap: 3 — ABNORMAL LOW (ref 7–16)
BUN: 18 mg/dL (ref 7–18)
Bilirubin,Total: 0.9 mg/dL (ref 0.2–1.0)
CHLORIDE: 104 mmol/L (ref 98–107)
CO2: 32 mmol/L (ref 21–32)
Calcium, Total: 9.6 mg/dL (ref 8.5–10.1)
Creatinine: 0.94 mg/dL (ref 0.60–1.30)
EGFR (African American): >60
Glucose: 99 mg/dL (ref 65–99)
Osmolality: 279 (ref 275–301)
Potassium: 3.4 mmol/L — ABNORMAL LOW (ref 3.5–5.1)
SGOT(AST): 37 U/L (ref 15–37)
SGPT (ALT): 33 U/L (ref 12–78)
SODIUM: 139 mmol/L (ref 136–145)
TOTAL PROTEIN: 7.6 g/dL (ref 6.4–8.2)

## 2013-06-03 LAB — CBC WITH DIFFERENTIAL/PLATELET
BASOS ABS: 0.1 10*3/uL (ref 0.0–0.1)
Basophil %: 0.8 %
EOS PCT: 0.5 %
Eosinophil #: 0 10*3/uL (ref 0.0–0.7)
HCT: 44.6 % (ref 40.0–52.0)
HGB: 15.1 g/dL (ref 13.0–18.0)
Lymphocyte #: 0.9 10*3/uL — ABNORMAL LOW (ref 1.0–3.6)
Lymphocyte %: 10.9 %
MCH: 31.6 pg (ref 26.0–34.0)
MCHC: 33.7 g/dL (ref 32.0–36.0)
MCV: 94 fL (ref 80–100)
MONOS PCT: 8.1 %
Monocyte #: 0.7 x10 3/mm (ref 0.2–1.0)
NEUTROS ABS: 6.8 10*3/uL — AB (ref 1.4–6.5)
Neutrophil %: 79.7 %
Platelet: 257 10*3/uL (ref 150–440)
RBC: 4.76 10*6/uL (ref 4.40–5.90)
RDW: 14.1 % (ref 11.5–14.5)
WBC: 8.6 10*3/uL (ref 3.8–10.6)

## 2013-06-04 LAB — CBC WITH DIFFERENTIAL/PLATELET
Basophil #: 0.1 10*3/uL (ref 0.0–0.1)
Basophil %: 1.3 %
Eosinophil #: 0.1 10*3/uL (ref 0.0–0.7)
Eosinophil %: 1.2 %
HCT: 41.6 % (ref 40.0–52.0)
HGB: 14.3 g/dL (ref 13.0–18.0)
LYMPHS PCT: 10.9 %
Lymphocyte #: 1.1 10*3/uL (ref 1.0–3.6)
MCH: 31.9 pg (ref 26.0–34.0)
MCHC: 34.3 g/dL (ref 32.0–36.0)
MCV: 93 fL (ref 80–100)
Monocyte #: 0.9 x10 3/mm (ref 0.2–1.0)
Monocyte %: 8.6 %
Neutrophil #: 7.9 10*3/uL — ABNORMAL HIGH (ref 1.4–6.5)
Neutrophil %: 78 %
Platelet: 201 10*3/uL (ref 150–440)
RBC: 4.48 10*6/uL (ref 4.40–5.90)
RDW: 14.1 % (ref 11.5–14.5)
WBC: 10.1 10*3/uL (ref 3.8–10.6)

## 2013-06-04 LAB — BASIC METABOLIC PANEL
Anion Gap: 7 (ref 7–16)
BUN: 14 mg/dL (ref 7–18)
CALCIUM: 9.2 mg/dL (ref 8.5–10.1)
CREATININE: 0.82 mg/dL (ref 0.60–1.30)
Chloride: 108 mmol/L — ABNORMAL HIGH (ref 98–107)
Co2: 27 mmol/L (ref 21–32)
EGFR (African American): >60
EGFR (Non-African Amer.): >60
GLUCOSE: 80 mg/dL (ref 65–99)
OSMOLALITY: 283 (ref 275–301)
Potassium: 3.5 mmol/L (ref 3.5–5.1)
Sodium: 142 mmol/L (ref 136–145)

## 2013-06-08 ENCOUNTER — Encounter: Payer: Self-pay | Admitting: *Deleted

## 2013-06-08 ENCOUNTER — Ambulatory Visit
Admission: RE | Admit: 2013-06-08 | Discharge: 2013-06-08 | Disposition: A | Discharge status: Home / Self Care | Payer: Medicare Other | Source: Ambulatory Visit | Attending: Radiation Oncology | Admitting: Radiation Oncology

## 2013-06-08 ENCOUNTER — Ambulatory Visit: Payer: Medicare Other | Admitting: Radiation Oncology

## 2013-06-08 ENCOUNTER — Encounter: Payer: Self-pay | Admitting: Radiation Oncology

## 2013-06-08 VITALS — BP 102/68 | HR 77 | Temp 97.4°F | Ht 69.0 in | Wt 177.3 lb

## 2013-06-08 DIAGNOSIS — IMO0002 Reserved for concepts with insufficient information to code with codable children: Secondary | ICD-10-CM

## 2013-06-08 NOTE — Progress Notes (Signed)
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 He is being treated to 9.9 delivered field widths corresponding to one set of IMRT treatment devices [phone]).

## 2013-06-08 NOTE — Progress Notes (Signed)
Weekly Management Note:  Site: Right neck including skull base Current Dose:  195  cGy Projected Dose: 6435  cGy  Narrative: The patient is seen today for routine under treatment assessment. CBCT/MVCT images/port films were reviewed. The chart was reviewed.   He is without new complaints today. Dr. Carlis Abbott was kind enough to tell me that he was hospitalized late last week for placement of a PEG. Dr. Carlis Abbott feels that he may be developing right cranial nerve 10 involvement and may be at risk for vocal cord paralysis which could cause some degree of dyspnea. He missed his appointment with Dr. Grayland Ormond for Erbitux earlier today. His family wants to have his Erbitux given here to avoid to travel to [place] from [place].  Physical Examination:  Filed Vitals:   06/08/13 1735  BP: 102/68  Pulse: 77  Temp: 97.4 F (36.3 C)  .  Weight: 177 lb 4.8 oz (80.423 kg). 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 Oral cavity and oropharynx are dry to inspection. There is been slight progression of his right lower neck scar recurrence. There is some crusting but no obvious drainage/ulceration  Impression: Tolerating radiation therapy well. We'll try to arrange for his Erbitux to be given here. This may not be until next week.  Plan: Continue radiation therapy as planned.

## 2013-06-09 ENCOUNTER — Ambulatory Visit
Admission: RE | Admit: 2013-06-09 | Discharge: 2013-06-09 | Disposition: A | Discharge status: Home / Self Care | Payer: Medicare Other | Source: Ambulatory Visit | Attending: Radiation Oncology | Admitting: Radiation Oncology

## 2013-06-09 ENCOUNTER — Telehealth: Payer: Self-pay | Admitting: *Deleted

## 2013-06-09 ENCOUNTER — Telehealth: Payer: Self-pay | Admitting: Hematology and Oncology

## 2013-06-09 NOTE — Telephone Encounter (Signed)
C/D 06/09/13 for appt. 06/16/13

## 2013-06-09 NOTE — Telephone Encounter (Addendum)
Per patient's verbalized request for his chemotherapy tmt to be managed at [facility] instead of [facility], and per Dr. Charlton Amor guidance, spoke with Dr. Gary Fleet RN, Selinda Eon (?[phone]), and requested referral to Dr. Alvy Bimler.  She stated she would facilitate the request.  Gayleen Orem, RN, BSN, [place] Neck Oncology Navigator [phone]

## 2013-06-10 ENCOUNTER — Inpatient Hospital Stay (HOSPITAL_COMMUNITY): Payer: Medicare Other

## 2013-06-10 ENCOUNTER — Inpatient Hospital Stay (HOSPITAL_COMMUNITY)
Admission: EM | Admit: 2013-06-10 | Discharge: 2013-06-17 | DRG: 871 | Disposition: E | Payer: Medicare Other | Attending: Internal Medicine | Admitting: Internal Medicine

## 2013-06-10 ENCOUNTER — Ambulatory Visit: Payer: Self-pay | Admitting: Oncology

## 2013-06-10 ENCOUNTER — Emergency Department (HOSPITAL_COMMUNITY): Payer: Medicare Other

## 2013-06-10 ENCOUNTER — Encounter (HOSPITAL_COMMUNITY): Payer: Self-pay | Admitting: Emergency Medicine

## 2013-06-10 ENCOUNTER — Ambulatory Visit: Admission: RE | Admit: 2013-06-10 | Payer: Medicare Other | Source: Ambulatory Visit

## 2013-06-10 DIAGNOSIS — N189 Chronic kidney disease, unspecified: Secondary | ICD-10-CM | POA: Diagnosis present

## 2013-06-10 DIAGNOSIS — M129 Arthropathy, unspecified: Secondary | ICD-10-CM | POA: Diagnosis present

## 2013-06-10 DIAGNOSIS — J9600 Acute respiratory failure, unspecified whether with hypoxia or hypercapnia: Secondary | ICD-10-CM | POA: Diagnosis present

## 2013-06-10 DIAGNOSIS — R4182 Altered mental status, unspecified: Secondary | ICD-10-CM

## 2013-06-10 DIAGNOSIS — C4442 Squamous cell carcinoma of skin of scalp and neck: Secondary | ICD-10-CM | POA: Diagnosis present

## 2013-06-10 DIAGNOSIS — A419 Sepsis, unspecified organism: Secondary | ICD-10-CM | POA: Diagnosis present

## 2013-06-10 DIAGNOSIS — R6521 Severe sepsis with septic shock: Secondary | ICD-10-CM

## 2013-06-10 DIAGNOSIS — I129 Hypertensive chronic kidney disease with stage 1 through stage 4 chronic kidney disease, or unspecified chronic kidney disease: Secondary | ICD-10-CM | POA: Diagnosis present

## 2013-06-10 DIAGNOSIS — J9601 Acute respiratory failure with hypoxia: Secondary | ICD-10-CM

## 2013-06-10 DIAGNOSIS — IMO0002 Reserved for concepts with insufficient information to code with codable children: Secondary | ICD-10-CM

## 2013-06-10 DIAGNOSIS — J690 Pneumonitis due to inhalation of food and vomit: Secondary | ICD-10-CM | POA: Diagnosis present

## 2013-06-10 DIAGNOSIS — H02439 Paralytic ptosis unspecified eyelid: Secondary | ICD-10-CM | POA: Diagnosis present

## 2013-06-10 DIAGNOSIS — Z931 Gastrostomy status: Secondary | ICD-10-CM

## 2013-06-10 DIAGNOSIS — E43 Unspecified severe protein-calorie malnutrition: Secondary | ICD-10-CM | POA: Diagnosis present

## 2013-06-10 DIAGNOSIS — Z66 Do not resuscitate: Secondary | ICD-10-CM | POA: Diagnosis not present

## 2013-06-10 DIAGNOSIS — R1310 Dysphagia, unspecified: Secondary | ICD-10-CM | POA: Diagnosis present

## 2013-06-10 DIAGNOSIS — Z515 Encounter for palliative care: Secondary | ICD-10-CM

## 2013-06-10 DIAGNOSIS — Z6827 Body mass index (BMI) 27.0-27.9, adult: Secondary | ICD-10-CM

## 2013-06-10 DIAGNOSIS — Z79899 Other long term (current) drug therapy: Secondary | ICD-10-CM

## 2013-06-10 DIAGNOSIS — R6520 Severe sepsis without septic shock: Secondary | ICD-10-CM

## 2013-06-10 DIAGNOSIS — Z87891 Personal history of nicotine dependence: Secondary | ICD-10-CM

## 2013-06-10 DIAGNOSIS — Z923 Personal history of irradiation: Secondary | ICD-10-CM

## 2013-06-10 DIAGNOSIS — J189 Pneumonia, unspecified organism: Secondary | ICD-10-CM | POA: Diagnosis present

## 2013-06-10 LAB — BLOOD GAS, ARTERIAL
Acid-Base Excess: 1.4 mmol/L (ref 0.0–2.0)
Bicarbonate: 27.7 mEq/L — ABNORMAL HIGH (ref 20.0–24.0)
Drawn by: 103701
FIO2: 1 %
MECHVT: 520 mL
O2 Saturation: 99.1 %
PEEP: 5 cmH2O
Patient temperature: 98.6
RATE: 14 resp/min
TCO2: 25 mmol/L (ref 0–100)
pCO2 arterial: 53.9 mmHg — ABNORMAL HIGH (ref 35.0–45.0)
pH, Arterial: 7.332 — ABNORMAL LOW (ref 7.350–7.450)
pO2, Arterial: 250 mmHg — ABNORMAL HIGH (ref 80.0–100.0)

## 2013-06-10 LAB — COMPREHENSIVE METABOLIC PANEL
ALBUMIN: 3.5 g/dL (ref 3.5–5.2)
ALBUMIN: 2.6 g/dL — AB (ref 3.5–5.2)
ALK PHOS: 91 U/L (ref 39–117)
ALT: 46 U/L (ref 0–53)
ALT: 30 U/L (ref 0–53)
AST: 64 U/L — ABNORMAL HIGH (ref 0–37)
AST: 29 U/L (ref 0–37)
Alkaline Phosphatase: 112 U/L (ref 39–117)
BUN: 21 mg/dL (ref 6–23)
BUN: 21 mg/dL (ref 6–23)
CALCIUM: 10.1 mg/dL (ref 8.4–10.5)
CO2: 28 mEq/L (ref 19–32)
CO2: 27 mEq/L (ref 19–32)
Calcium: 8.6 mg/dL (ref 8.4–10.5)
Chloride: 96 mEq/L (ref 96–112)
Chloride: 101 mEq/L (ref 96–112)
Creatinine, Ser: 0.73 mg/dL (ref 0.50–1.35)
Creatinine, Ser: 0.81 mg/dL (ref 0.50–1.35)
GFR calc Af Amer: >90 mL/min (ref >90)
GFR calc Af Amer: >90 mL/min (ref >90)
GFR calc non Af Amer: 85 mL/min — ABNORMAL LOW (ref >90)
GFR calc non Af Amer: 82 mL/min — ABNORMAL LOW (ref >90)
Glucose, Bld: 172 mg/dL — ABNORMAL HIGH (ref 70–99)
Glucose, Bld: 159 mg/dL — ABNORMAL HIGH (ref 70–99)
POTASSIUM: 6 meq/L — AB (ref 3.7–5.3)
POTASSIUM: 4.2 meq/L (ref 3.7–5.3)
SODIUM: 135 meq/L — AB (ref 137–147)
Sodium: 138 mEq/L (ref 137–147)
TOTAL PROTEIN: 7.5 g/dL (ref 6.0–8.3)
Total Bilirubin: 0.7 mg/dL (ref 0.3–1.2)
Total Bilirubin: 0.8 mg/dL (ref 0.3–1.2)
Total Protein: 5.2 g/dL — ABNORMAL LOW (ref 6.0–8.3)

## 2013-06-10 LAB — URINE MICROSCOPIC-ADD ON

## 2013-06-10 LAB — CBC WITH DIFFERENTIAL/PLATELET
Basophils Absolute: 0 10*3/uL (ref 0.0–0.1)
Basophils Relative: 0 % (ref 0–1)
EOS ABS: 0 10*3/uL (ref 0.0–0.7)
EOS PCT: 0 % (ref 0–5)
HEMATOCRIT: 44.5 % (ref 39.0–52.0)
Hemoglobin: 15.2 g/dL (ref 13.0–17.0)
LYMPHS PCT: 4 % — AB (ref 12–46)
Lymphs Abs: 0.8 10*3/uL (ref 0.7–4.0)
MCH: 31.8 pg (ref 26.0–34.0)
MCHC: 34.2 g/dL (ref 30.0–36.0)
MCV: 93.1 fL (ref 78.0–100.0)
MONO ABS: 1.4 10*3/uL — AB (ref 0.1–1.0)
Monocytes Relative: 8 % (ref 3–12)
Neutro Abs: 16.3 10*3/uL — ABNORMAL HIGH (ref 1.7–7.7)
Neutrophils Relative %: 88 % — ABNORMAL HIGH (ref 43–77)
PLATELETS: 276 10*3/uL (ref 150–400)
RBC: 4.78 MIL/uL (ref 4.22–5.81)
RDW: 13.7 % (ref 11.5–15.5)
WBC: 18.6 10*3/uL — AB (ref 4.0–10.5)

## 2013-06-10 LAB — I-STAT CG4 LACTIC ACID, ED: LACTIC ACID, VENOUS: 2.5 mmol/L — AB (ref 0.5–2.2)

## 2013-06-10 LAB — CARBOXYHEMOGLOBIN
Carboxyhemoglobin: 1.6 % — ABNORMAL HIGH (ref 0.5–1.5)
Carboxyhemoglobin: 1.4 % (ref 0.5–1.5)
METHEMOGLOBIN: 1.8 % — AB (ref 0.0–1.5)
Methemoglobin: 1.2 % (ref 0.0–1.5)
O2 Saturation: 70.6 %
O2 Saturation: 72.6 %
TOTAL HEMOGLOBIN: 13.6 g/dL (ref 13.5–18.0)
Total hemoglobin: 13.9 g/dL (ref 13.5–18.0)

## 2013-06-10 LAB — TROPONIN I
Troponin I: <0.3 ng/mL (ref <0.30)
Troponin I: <0.3 ng/mL (ref <0.30)

## 2013-06-10 LAB — CBC
HCT: 37.5 % — ABNORMAL LOW (ref 39.0–52.0)
Hemoglobin: 12.5 g/dL — ABNORMAL LOW (ref 13.0–17.0)
MCH: 31.2 pg (ref 26.0–34.0)
MCHC: 33.3 g/dL (ref 30.0–36.0)
MCV: 93.5 fL (ref 78.0–100.0)
PLATELETS: 233 10*3/uL (ref 150–400)
RBC: 4.01 MIL/uL — ABNORMAL LOW (ref 4.22–5.81)
RDW: 13.7 % (ref 11.5–15.5)
WBC: 12.1 10*3/uL — ABNORMAL HIGH (ref 4.0–10.5)

## 2013-06-10 LAB — URINALYSIS, ROUTINE W REFLEX MICROSCOPIC
BILIRUBIN URINE: NEGATIVE
Glucose, UA: NEGATIVE mg/dL
HGB URINE DIPSTICK: NEGATIVE
KETONES UR: NEGATIVE mg/dL
Leukocytes, UA: NEGATIVE
NITRITE: NEGATIVE
PH: 5.5 (ref 5.0–8.0)
Protein, ur: 30 mg/dL — AB
SPECIFIC GRAVITY, URINE: 1.021 (ref 1.005–1.030)
Urobilinogen, UA: 1 mg/dL (ref 0.0–1.0)

## 2013-06-10 LAB — LACTIC ACID, PLASMA: LACTIC ACID, VENOUS: 2.1 mmol/L (ref 0.5–2.2)

## 2013-06-10 LAB — I-STAT TROPONIN, ED: Troponin i, poc: 0.01 ng/mL (ref 0.00–0.08)

## 2013-06-10 LAB — GLUCOSE, CAPILLARY: Glucose-Capillary: 143 mg/dL — ABNORMAL HIGH (ref 70–99)

## 2013-06-10 MED ORDER — LIDOCAINE HCL (CARDIAC) 20 MG/ML IV SOLN
INTRAVENOUS | Status: AC
  Filled 2013-06-10: qty 5

## 2013-06-10 MED ORDER — SODIUM CHLORIDE 0.9 % IV SOLN
INTRAVENOUS | Status: DC
  Administered 2013-06-10: 75 mL/h via INTRAVENOUS
  Administered 2013-06-10 – 2013-06-11 (×2): via INTRAVENOUS

## 2013-06-10 MED ORDER — ASPIRIN 300 MG RE SUPP: 300.0000 mg | RECTAL | Status: AC

## 2013-06-10 MED ORDER — ROCURONIUM BROMIDE 50 MG/5ML IV SOLN
INTRAVENOUS | Status: AC
Start: 2013-06-10 — End: 2013-06-11
  Filled 2013-06-10: qty 2

## 2013-06-10 MED ORDER — VANCOMYCIN HCL IN DEXTROSE 1-5 GM/200ML-% IV SOLN
1000.0000 mg | Freq: Once | INTRAVENOUS | Status: AC
  Administered 2013-06-10: 1000 mg via INTRAVENOUS
  Filled 2013-06-10: qty 200

## 2013-06-10 MED ORDER — NOREPINEPHRINE BITARTRATE 1 MG/ML IJ SOLN
5.0000 ug/min | INTRAMUSCULAR | Status: DC
  Administered 2013-06-10 – 2013-06-11 (×2): 5 ug/min via INTRAVENOUS
  Filled 2013-06-10 (×2): qty 4

## 2013-06-10 MED ORDER — DEXTROSE 5 % IV SOLN
2.0000 g | Freq: Once | INTRAVENOUS | Status: AC
  Administered 2013-06-10: 2 g via INTRAVENOUS

## 2013-06-10 MED ORDER — SODIUM CHLORIDE 0.9 % IV SOLN
500.0000 mg | Freq: Four times a day (QID) | INTRAVENOUS | Status: DC
  Administered 2013-06-10 – 2013-06-13 (×10): 500 mg via INTRAVENOUS
  Filled 2013-06-10 (×12): qty 500

## 2013-06-10 MED ORDER — SODIUM CHLORIDE 0.9 % IV SOLN: 250.0000 mL | INTRAVENOUS | Status: DC | PRN

## 2013-06-10 MED ORDER — SODIUM CHLORIDE 0.9 % IV SOLN
1000.0000 mL | INTRAVENOUS | Status: DC
  Administered 2013-06-10: 1000 mL via INTRAVENOUS

## 2013-06-10 MED ORDER — SODIUM CHLORIDE 0.9 % IV SOLN
1000.0000 mL | Freq: Once | INTRAVENOUS | Status: AC
  Administered 2013-06-10: 1000 mL via INTRAVENOUS

## 2013-06-10 MED ORDER — ASPIRIN 81 MG PO CHEW: 324.0000 mg | CHEWABLE_TABLET | ORAL | Status: AC

## 2013-06-10 MED ORDER — PHENYLEPHRINE 200 MCG/ML FOR PRIAPISM / HYPOTENSION
100.0000 ug | Freq: Once | INTRAMUSCULAR | Status: AC
  Administered 2013-06-10: 100 ug via INTRAVENOUS

## 2013-06-10 MED ORDER — ETOMIDATE 2 MG/ML IV SOLN
INTRAVENOUS | Status: AC
  Administered 2013-06-10: 20 mg
  Filled 2013-06-10: qty 20

## 2013-06-10 MED ORDER — FENTANYL CITRATE 0.05 MG/ML IJ SOLN: 50.0000 ug | Freq: Once | INTRAMUSCULAR | Status: DC

## 2013-06-10 MED ORDER — PANTOPRAZOLE SODIUM 40 MG IV SOLR
40.0000 mg | Freq: Every day | INTRAVENOUS | Status: DC
  Administered 2013-06-10: 40 mg via INTRAVENOUS
  Filled 2013-06-10: qty 40

## 2013-06-10 MED ORDER — MIDAZOLAM HCL 2 MG/2ML IJ SOLN
INTRAMUSCULAR | Status: AC
  Administered 2013-06-10: 2 mg
  Filled 2013-06-10: qty 4

## 2013-06-10 MED ORDER — VANCOMYCIN HCL IN DEXTROSE 1-5 GM/200ML-% IV SOLN
1000.0000 mg | Freq: Two times a day (BID) | INTRAVENOUS | Status: DC
  Administered 2013-06-11 – 2013-06-12 (×3): 1000 mg via INTRAVENOUS
  Filled 2013-06-10 (×4): qty 200

## 2013-06-10 MED ORDER — VANCOMYCIN HCL IN DEXTROSE 1-5 GM/200ML-% IV SOLN
1000.0000 mg | INTRAVENOUS | Status: AC
  Administered 2013-06-10: 1000 mg via INTRAVENOUS
  Filled 2013-06-10: qty 200

## 2013-06-10 MED ORDER — SODIUM CHLORIDE 0.9 % IV SOLN
0.0000 ug/h | INTRAVENOUS | Status: DC
  Administered 2013-06-12: 50 ug/h via INTRAVENOUS
  Filled 2013-06-10: qty 50

## 2013-06-10 MED ORDER — PHENYLEPHRINE HCL 10 MG/ML IJ SOLN
30.0000 ug/min | INTRAVENOUS | Status: DC
  Administered 2013-06-10: 70 ug/min via INTRAVENOUS
  Filled 2013-06-10: qty 1

## 2013-06-10 MED ORDER — PROPOFOL 10 MG/ML IV EMUL
INTRAVENOUS | Status: AC
  Filled 2013-06-10: qty 100

## 2013-06-10 MED ORDER — SODIUM CHLORIDE 0.9 % IV BOLUS (SEPSIS)
1000.0000 mL | INTRAVENOUS | Status: DC | PRN
  Administered 2013-06-10 – 2013-06-11 (×2): 1000 mL via INTRAVENOUS

## 2013-06-10 MED ORDER — SUCCINYLCHOLINE CHLORIDE 20 MG/ML IJ SOLN
INTRAMUSCULAR | Status: AC
  Filled 2013-06-10: qty 1

## 2013-06-10 MED ORDER — INSULIN ASPART 100 UNIT/ML ~~LOC~~ SOLN
0.0000 [IU] | SUBCUTANEOUS | Status: DC
Start: 2013-06-10 — End: 2013-06-12
  Administered 2013-06-10: 1 [IU] via SUBCUTANEOUS

## 2013-06-10 MED ORDER — FENTANYL BOLUS VIA INFUSION
25.0000 ug | INTRAVENOUS | Status: DC | PRN
  Administered 2013-06-11: 25 ug via INTRAVENOUS
  Administered 2013-06-12: 50 ug via INTRAVENOUS
  Filled 2013-06-10: qty 50

## 2013-06-10 MED ORDER — PHENYLEPHRINE 200 MCG/ML FOR PRIAPISM / HYPOTENSION
INTRAMUSCULAR | Status: AC
  Administered 2013-06-10: 100 ug
  Filled 2013-06-10: qty 50

## 2013-06-10 MED ORDER — HEPARIN SODIUM (PORCINE) 5000 UNIT/ML IJ SOLN
5000.0000 [IU] | Freq: Three times a day (TID) | INTRAMUSCULAR | Status: DC
  Administered 2013-06-10 – 2013-06-11 (×2): 5000 [IU] via SUBCUTANEOUS
  Filled 2013-06-10 (×5): qty 1

## 2013-06-10 MED ORDER — SODIUM CHLORIDE 0.9 % IV SOLN
10.0000 ug/h | INTRAVENOUS | Status: DC
  Administered 2013-06-10: 10 ug/h via INTRAVENOUS
  Filled 2013-06-10: qty 50

## 2013-06-10 MED ORDER — ACETAMINOPHEN 160 MG/5ML PO SOLN
650.0000 mg | Freq: Four times a day (QID) | ORAL | Status: DC | PRN
  Administered 2013-06-10 – 2013-06-11 (×2): 650 mg
  Filled 2013-06-10 (×2): qty 20.3

## 2013-06-10 NOTE — Procedures (Signed)
Central Venous Catheter Insertion Procedure Note Wyatt [name] [id number] [birth year]  Procedure: Insertion of Central Venous Catheter Indications: Assessment of intravascular volume, Drug and/or fluid administration and Frequent blood sampling  Procedure Details Consent: Risks of procedure as well as the alternatives and risks of each were explained to the (patient/caregiver).  Consent for procedure obtained. Time Out: Verified patient identification, verified procedure, site/side was marked, verified correct patient position, special equipment/implants available, medications/allergies/relevent history reviewed, required imaging and test results available.  Performed  Maximum sterile technique was used including antiseptics, cap, gloves, gown, hand hygiene, mask and sheet. Skin prep: Chlorhexidine; local anesthetic administered A antimicrobial bonded/coated triple lumen catheter was placed in the left internal jugular vein using the Seldinger technique.  Evaluation Blood flow good Complications: No apparent complications Patient did tolerate procedure well. Chest X-ray ordered to verify placement.  CXR: pending.  Wyatt [name] 05/29/2013, 5:05 PM  [place]

## 2013-06-10 NOTE — Sedation Documentation (Signed)
1453 20mg  etomidate was given.  1454 et tube was inserted  1456 2 mg verseg given 1610 ET tube located 25 at lip 1457 CO2 indicator turned yellow 1458 ET tube placement verified by auscultation by Tammy, RT 1508 125mcg phenylephrine given 1513 111mcg phenylephrine given 1517 ET tube placement verified using X-ray

## 2013-06-10 NOTE — Progress Notes (Signed)
ANTIBIOTIC CONSULT NOTE - INITIAL  Pharmacy Consult for Vancomycin, primaxin Indication: pneumonia  No Known Allergies  Patient Measurements: Height: 5\' 7"  (170.2 cm) Weight: 177 lb (80.287 kg) IBW/kg (Calculated) : 66.1  Vital Signs: BP: 81/54 mmHg (03/25 1525) Pulse Rate: 89 (03/25 1525)  Labs:  Recent Labs  05/23/2013 1505  WBC 18.6*  HGB 15.2  PLT 276   CrCl is unknown because no creatinine reading has been taken.   Microbiology: No results found for this or any previous visit (from the past 720 hour(s)).  Medical History: Past Medical History  Diagnosis Date  . Bell's palsy   . Hypertension   . Chronic kidney disease   . Squamous cell carcinoma 04/23/13    right lower neck mass  . Arthritis   . Paralytic ptosis   . Cervicalgia   . Hx of radiation therapy 07/28/12- 09/03/12    5000 cGy right cheek, 25 fractions    Medications:  Anti-infectives   Start     Dose/Rate Route Frequency Ordered Stop   05/24/2013 1530  aztreonam (AZACTAM) 2 g in dextrose 5 % 50 mL IVPB     2 g 100 mL/hr over 30 Minutes Intravenous  Once 06/11/2013 1522     06/08/2013 1530  vancomycin (VANCOCIN) IVPB 1000 mg/200 mL premix     1,000 mg 200 mL/hr over 60 Minutes Intravenous  Once 05/29/2013 1522       Assessment: Wyatt [name] admitted 3/25 from ED with suspected HCAP.  Pt presents from home via EMS with AMS and respiratory distress.  PMH significant for recurrent squamous cell carcinoma involving the right neck and facial nerves currently receiving chemo and radiation.  Intubated in ED.  Pharmacy consulted to dose vancomycin and primaxin.  First doses of antibiotics (vanc and aztreonam) given in ED ~ 1600.  Since aztreonam dose was given at ~1600, will delay first imipenem dose until later to avoid excessive dosing.  Tmax: none recorded  WBCs: 18.6  Renal: SCr ~ 0.73, CrCl ~ 75 ml/min   Lactic acid 2.5 (3/25)  Blood and urine cultures pending.  Goal of Therapy:  Vancomycin trough level  15-20 mcg/ml Appropriate abx dosing, eradication of infection.   Plan:   Primaxin 500mg  IV q6h  Vancomycin 1g IV x1 plus additional 1g dose for total loading dose = 2g  Vancomycin maintenance dose 1g IV q12h.  Measure Vanc trough at steady state.  Follow up renal fxn and culture results.  Gretta Arab PharmD, BCPS Pager [phone] 05/19/2013 6:Wyatt PM

## 2013-06-10 NOTE — Progress Notes (Signed)
Pt in [facility] ED being evaluated. Per Dr Valere Dross, pt on treatment break today. Katie, RT linac #4 notified.

## 2013-06-10 NOTE — Therapy (Signed)
Pt transported to ICU from ED with vent.

## 2013-06-10 NOTE — ED Notes (Signed)
Pt to ED from home with EMS with altered mental status and respiratory distress.  Per report from family member to EMS pt has had symptoms for the past two months.  Pt SpO2 64% on room air on arrival.

## 2013-06-10 NOTE — Progress Notes (Signed)
E Link RN notified of pt's core temp rising.

## 2013-06-10 NOTE — ED Notes (Signed)
[facility] EDP made aware of patient CG4 Lactic results. 

## 2013-06-10 NOTE — ED Provider Notes (Signed)
CSN: [id number]     Arrival date & time 05/26/2013  1424 History   First MD Initiated Contact with Patient 05/23/2013 1432     Chief Complaint  Patient presents with  . Altered Mental Status  . Respiratory Distress     (Consider location/radiation/quality/duration/timing/severity/associated sxs/prior Treatment) Patient is a 80 y.o. male presenting with altered mental status. The history is provided by the patient.  Altered Mental Status Presenting symptoms: disorientation and partial responsiveness   Presenting symptoms: no unresponsiveness   Severity:  Moderate Most recent episode:  Today Episode history:  Single Timing:  Constant Progression:  Worsening Chronicity:  New Context: recent illness (recent initiation of chemo/radiation)   Context: not dementia and not a recent infection   Associated symptoms: difficulty breathing   Associated symptoms: no abdominal pain, no agitation, no bladder incontinence, no decreased appetite, no depression, no fever, no nausea, no rash, no seizures and no vomiting     Past Medical History  Diagnosis Date  . Bell's palsy   . Hypertension   . Chronic kidney disease   . Squamous cell carcinoma 04/23/13    right lower neck mass  . Arthritis   . Paralytic ptosis   . Cervicalgia   . Hx of radiation therapy 07/28/12- 09/03/12    5000 cGy right cheek, 25 fractions   Past Surgical History  Procedure Laterality Date  . Eyelid surgery Right     platinum weight placement/removal  . Resection right cheek mass Right 07/17/2012  . Back surgery  1999   History reviewed. No pertinent family history. History  Substance Use Topics  . Smoking status: Former Smoker -- 0.50 packs/day    Types: Cigarettes    Quit date: 03/19/1986  . Smokeless tobacco: Never Used  . Alcohol Use: Yes     Comment: social drinker on the weekend 4-5 drinks per weekend     Review of Systems  Unable to perform ROS: Severe respiratory distress  Constitutional: Negative for  fever and decreased appetite.  Gastrointestinal: Negative for nausea, vomiting and abdominal pain.  Genitourinary: Negative for bladder incontinence.  Skin: Negative for rash.  Neurological: Negative for seizures.  Psychiatric/Behavioral: Negative for agitation.      Allergies  Review of patient's allergies indicates no known allergies.  Home Medications   Current Outpatient Rx  Name  Route  Sig  Dispense  Refill  . donepezil (ARICEPT) 10 MG tablet   PEG Tube   10 mg by PEG Tube route at bedtime.          . Dutasteride-Tamsulosin HCl (JALYN) 0.5-0.4 MG CAPS   PEG Tube   1 capsule by PEG Tube route every morning.         . ezetimibe-simvastatin (VYTORIN) 10-20 MG per tablet   PEG Tube   1 tablet by PEG Tube route at bedtime.         . gabapentin (NEURONTIN) 300 MG capsule   PEG Tube   600 mg by PEG Tube route 3 (three) times daily.          Marland Kitchen HYDROcodone-acetaminophen (NORCO/VICODIN) 5-325 MG per tablet   PEG Tube   1 tablet by PEG Tube route every 6 (six) hours as needed for moderate pain.          Marland Kitchen levETIRAcetam (KEPPRA) 500 MG tablet   PEG Tube   500 mg by PEG Tube route 2 (two) times daily.         Marland Kitchen losartan (COZAAR) 100 MG tablet  PEG Tube   100 mg by PEG Tube route daily.         . memantine (NAMENDA) 10 MG tablet   PEG Tube   10 mg by PEG Tube route 2 (two) times daily.          . metoprolol tartrate (LOPRESSOR) 25 MG tablet   PEG Tube   25 mg by PEG Tube route 2 (two) times daily.         . Nutritional Supplements (FEEDING SUPPLEMENT, JEVITY 1.5 CAL,) LIQD   Per Tube   Place 355.5 mLs into feeding tube 4 (four) times daily. 8am, 12 noon, 4pm, and 8pm.         . sodium fluoride (FLUORISHIELD) 1.1 % GEL dental gel      Instill 1 drop into each tooth space of the fluoride tray. Place over teeth for 5 minutes. Remove. Spit out excess. Repeat nightly.   120 mL   prn   . solifenacin (VESICARE) 10 MG tablet   PEG Tube   10 mg by  PEG Tube route every morning.         . venlafaxine (EFFEXOR) 75 MG tablet   PEG Tube   75 mg by PEG Tube route 2 (two) times daily with a meal.           BP 81/54  Pulse 89  Resp 26  Ht 5\' 7"  (1.702 m)  Wt 177 lb (80.287 kg)  BMI 27.72 kg/m2  SpO2 95% Physical Exam  Nursing note and vitals reviewed. Constitutional: He is oriented to person, place, and time. He appears well-developed and well-nourished. He appears distressed.  HENT:  Head: Normocephalic and atraumatic.  Mouth/Throat: No oropharyngeal exudate.  Eyes: EOM are normal. Pupils are equal, round, and reactive to light.  Neck: Normal range of motion. Neck supple. No JVD present. No thyromegaly present.  Cardiovascular: Normal rate and regular rhythm.  Exam reveals no friction rub.   No murmur heard. Pulmonary/Chest: He is in respiratory distress. He has wheezes. He has rales.  Rhonchi diffusely Gurgling respirations Tachypneic with labored breathing  Abdominal: He exhibits no distension. There is no tenderness. There is no rebound.  Musculoskeletal: Normal range of motion. He exhibits no edema.  Neurological: He is alert and oriented to person, place, and time. No cranial nerve deficit. He exhibits normal muscle tone. Coordination normal.  Skin: No rash noted. He is not diaphoretic.    ED Course  INTUBATION Date/Time: 05/18/2013 3:49 PM Performed by: Osvaldo Shipper Authorized by: Osvaldo Shipper Consent: Verbal consent obtained. Time out: Immediately prior to procedure a "time out" was called to verify the correct patient, procedure, equipment, support staff and site/side marked as required. Indications: respiratory distress,  airway protection and  respiratory failure Intubation method: video-assisted Patient status: sedated Preoxygenation: nonrebreather mask Sedatives: etomidate Laryngoscope size: Glidescope 3. Tube size: 8.0 mm Tube type: cuffed Number of attempts: 1 Cricoid pressure:  no Cords visualized: yes Post-procedure assessment: chest rise and ETCO2 monitor Breath sounds: equal Cuff inflated: yes ETT to lip: 25 cm Tube secured with: ETT holder Chest x-ray interpreted by me. Chest x-ray findings: endotracheal tube in appropriate position Patient tolerance: Patient tolerated the procedure well with no immediate complications.   (including critical care time) Labs Review Labs Reviewed  CBC WITH DIFFERENTIAL - Abnormal; Notable for the following:    WBC 18.6 (*)    Neutrophils Relative % 88 (*)    Neutro Abs 16.3 (*)  Lymphocytes Relative 4 (*)    Monocytes Absolute 1.4 (*)    All other components within normal limits  I-STAT CG4 LACTIC ACID, ED - Abnormal; Notable for the following:    Lactic Acid, Venous 2.50 (*)    All other components within normal limits  CULTURE, BLOOD (ROUTINE X 2)  CULTURE, BLOOD (ROUTINE X 2)  URINE CULTURE  CBC  COMPREHENSIVE METABOLIC PANEL  URINALYSIS, ROUTINE W REFLEX MICROSCOPIC  I-STAT TROPOININ, ED  I-STAT CG4 LACTIC ACID, ED   Imaging Review No results found.   EKG Interpretation None      CRITICAL CARE Performed by: Osvaldo Shipper   Total critical care time: 45 minutes  Critical care time was exclusive of separately billable procedures and treating other patients.  Critical care was necessary to treat or prevent imminent or life-threatening deterioration.  Critical care was time spent personally by me on the following activities: development of treatment plan with patient and/or surrogate as well as nursing, discussions with consultants, evaluation of patient's response to treatment, examination of patient, obtaining history from patient or surrogate, ordering and performing treatments and interventions, ordering and review of laboratory studies, ordering and review of radiographic studies, pulse oximetry and re-evaluation of patient's condition.  MDM   Final diagnoses:  Acute respiratory  failure with hypoxia  Altered mental status  Aspiration pneumonia    54M presents with altered mental status, respiratory distress. Recently began chemo/radiation for a R sided neck mass. Here altered, hypoxic - in the low 90s on 15L, was initially 60s with EMS. Patient arousable to speech but intermittently following commands. Gurgling respirations. Decision made to intubate. Spoke with family - fiance who is HCPOA, stepson, brother - prior to intubation. All in agreement that they want and he wants all measures taken to ensure his survival. They are in agreement with intubation. Etomidate given for awake intubation as unsure if he has distorted anatomy with his neck mass. Glidescope intubation with size 8 ET tube. Copious purulent secretions s/p intubation.  Patient had post-intubation hypotension that resolved with fluids and 2 small boluses of phenylephrine. Will sedate with fentanyl. Labs and cultures drawn. Broad spectrum antibiotics initiated. Critical Care coming to evaluate patient for admission.    Osvaldo Shipper, MD June 30, 2013 [phone]

## 2013-06-10 NOTE — ED Notes (Signed)
Pt's pressure dropped so we are holding off on second set of cultures per Dr. Mingo Amber.

## 2013-06-10 NOTE — ED Notes (Signed)
Bed: HK32 Expected date:  Expected time:  Means of arrival:  Comments: EMS - Throat CA, Decreased LOC, aspiration???

## 2013-06-10 NOTE — H&P (Signed)
PULMONARY / CRITICAL CARE MEDICINE   Name: Wyatt [name] MRN: [id number] DOB: 11/03/1930    ADMISSION DATE:  05/21/2013 CONSULTATION DATE:  05/18/2013  REFERRING MD :  Mingo Amber, MD PRIMARY SERVICE: PCCM  CHIEF COMPLAINT:  Resp failure  BRIEF PATIENT DESCRIPTION:  80 yr old neck cancer, Aspiration PNA, Sepsis, Vented  SIGNIFICANT EVENTS / STUDIES:  3/25 - ETT, Pressors  LINES / TUBES: Left IJ 3/25>>> ETT 3/25>>>  CULTURES: 3/25 BC>>> 3/25 Sputum>>>  ANTIBIOTICS: 3/25 Vanc>>> 3/25 Iminem>>>  HISTORY OF PRESENT ILLNESS:  80 yr old WM neck cancer, PEG, reasonably fxnal, present resp failure. Has had radiation. Unclear about chemo at this stage, famly said it is planned but not done. Recent PEG placed Friday and reported as high asp risk. IN ED , confused, hypoxic. ETT placed. Drop in BP, neo started, volume given. Called to admit. Also noted was esophageal extrinsic compression from family.   PAST MEDICAL HISTORY :  Past Medical History  Diagnosis Date  . Bell's palsy   . Hypertension   . Chronic kidney disease   . Squamous cell carcinoma 04/23/13    right lower neck mass  . Arthritis   . Paralytic ptosis   . Cervicalgia   . Hx of radiation therapy 07/28/12- 09/03/12    5000 cGy right cheek, 25 fractions   Past Surgical History  Procedure Laterality Date  . Eyelid surgery Right     platinum weight placement/removal  . Resection right cheek mass Right 07/17/2012  . Back surgery  1999   Prior to Admission medications   Medication Sig Start Date End Date Taking? Authorizing Provider  donepezil (ARICEPT) 10 MG tablet 10 mg by PEG Tube route at bedtime.    Yes Historical Provider, MD  Dutasteride-Tamsulosin HCl (JALYN) 0.5-0.4 MG CAPS 1 capsule by PEG Tube route every morning.   Yes Historical Provider, MD  ezetimibe-simvastatin (VYTORIN) 10-20 MG per tablet 1 tablet by PEG Tube route at bedtime.   Yes Historical Provider, MD  gabapentin (NEURONTIN) 300 MG capsule 600 mg by PEG  Tube route 3 (three) times daily.  05/23/13  Yes Historical Provider, MD  HYDROcodone-acetaminophen (NORCO/VICODIN) 5-325 MG per tablet 1 tablet by PEG Tube route every 6 (six) hours as needed for moderate pain.    Yes Historical Provider, MD  levETIRAcetam (KEPPRA) 500 MG tablet 500 mg by PEG Tube route 2 (two) times daily.   Yes Historical Provider, MD  losartan (COZAAR) 100 MG tablet 100 mg by PEG Tube route daily.   Yes Historical Provider, MD  memantine (NAMENDA) 10 MG tablet 10 mg by PEG Tube route 2 (two) times daily.    Yes Historical Provider, MD  metoprolol tartrate (LOPRESSOR) 25 MG tablet 25 mg by PEG Tube route 2 (two) times daily.   Yes Historical Provider, MD  Nutritional Supplements (FEEDING SUPPLEMENT, JEVITY 1.5 CAL,) LIQD Place 355.5 mLs into feeding tube 4 (four) times daily. 8am, 12 noon, 4pm, and 8pm.   Yes Historical Provider, MD  sodium fluoride (FLUORISHIELD) 1.1 % GEL dental gel Instill 1 drop into each tooth space of the fluoride tray. Place over teeth for 5 minutes. Remove. Spit out excess. Repeat nightly. 05/13/13  Yes Lenn Cal, DDS  solifenacin (VESICARE) 10 MG tablet 10 mg by PEG Tube route every morning.   Yes Historical Provider, MD  venlafaxine (EFFEXOR) 75 MG tablet 75 mg by PEG Tube route 2 (two) times daily with a meal.  05/31/13  Yes Historical Provider, MD  No Known Allergies  FAMILY HISTORY:  History reviewed. No pertinent family history. SOCIAL HISTORY:  reports that he quit smoking about 27 years ago. His smoking use included Cigarettes. He smoked 0.50 packs per day. He has never used smokeless tobacco. He reports that he drinks alcohol. He reports that he does not use illicit drugs.  REVIEW OF SYSTEMS:  Unable, but son in law to be stated a lot of upper airway sounds recently  SUBJECTIVE:   VITAL SIGNS: Pulse Rate:  [87-122] 89 (03/25 1525) Resp:  [15-30] 26 (03/25 1525) BP: (43-141)/(28-96) 81/54 mmHg (03/25 1525) SpO2:  [90 %-100 %] 95 %  (03/25 1525) FiO2 (%):  [100 %] 100 % (03/25 1455) Weight:  [80.287 kg (177 lb)] 80.287 kg (177 lb) (03/25 1520) HEMODYNAMICS:   VENTILATOR SETTINGS: Vent Mode:  [-] PRVC FiO2 (%):  [100 %] 100 % Set Rate:  [14 bmp] 14 bmp Vt Set:  [530 mL] 530 mL PEEP:  [5 cmH20] 5 cmH20 Plateau Pressure:  [20 cmH20] 20 cmH20 INTAKE / OUTPUT: Intake/Output   None     PHYSICAL EXAMINATION: General:  Sedated rass -3 Neuro:  rass -3 HEENT:  Large mass rt neck Cardiovascular:  s1 s2 rrt Lungs:  ronchi left Abdomen:  Soft,. bs peg, no r Musculoskeletal:  Edema no Skin:  No rash  LABS:  CBC  Recent Labs Lab 05/26/2013 1505  WBC 18.6*  HGB 15.2  HCT 44.5  PLT 276   Coag's No results found for this basename: APTT, INR,  in the last 168 hours BMET No results found for this basename: NA, K, CL, CO2, BUN, CREATININE, GLUCOSE,  in the last 168 hours Electrolytes No results found for this basename: CALCIUM, MG, PHOS,  in the last 168 hours Sepsis Markers  Recent Labs Lab 05/27/2013 1518  LATICACIDVEN 2.50*   ABG No results found for this basename: PHART, PCO2ART, PO2ART,  in the last 168 hours Liver Enzymes No results found for this basename: AST, ALT, ALKPHOS, BILITOT, ALBUMIN,  in the last 168 hours Cardiac Enzymes No results found for this basename: TROPONINI, PROBNP,  in the last 168 hours Glucose No results found for this basename: GLUCAP,  in the last 168 hours  Imaging Dg Chest Port 1 View  05/18/2013   CLINICAL DATA:  Respiratory distress status post endotracheal placement  EXAM: PORTABLE CHEST - 1 VIEW  COMPARISON:  None.  FINDINGS: An endotracheal tube is noted 4.6 cm above the carina. Cardiac shadow is within normal limits. Increased density is noted in the left retrocardiac region which may represent focal infiltrate.  IMPRESSION: Left retrocardiac infiltrate. Followup films to resolution are recommended.   Electronically Signed   By: Inez Catalina M.D.   On: 05/21/2013 15:44      CXR: left side infiltrate, ett wnl  ASSESSMENT / PLAN:  PULMONARY A:Acute resp failure, PNA, aspiration  likely (reported vomiting, poor airway control) P:   ABg reviewed, rate to 16 Repeat abg in am  Likely SBt in am  Monitor for bilat infiltrates on pcxr in am  To 50%  CARDIOVASCULAR A: Septic shock P:  Place line Repeat lactic Cortisol cvp  cvp goal 12 No SVO2 Tele Neo to levo once line placed May need vaso  RENAL A:  At riskl ATN, awaited labs, sepsis P:   Volume Saline Chem awaited  GASTROINTESTINAL A:  Esophageal compression, dysphagia per family from squam neck  mass P:   ppi Npo Will need further radiation from  mass neck lft needed  HEMATOLOGIC A:  squamous cell cancer neck, dvt risk P:  Sub q hep If crt wnl, could change to lovenox in setting cancer Cbc in am  scd  INFECTIOUS A:  Asp PNA, hcap, immune suppression to some extent, family does NOT report all pcn P:   vanc , imi for now [facility] sputum  ENDOCRINE A:  R/o rel AI P:   Cortisol cbg  NEUROLOGIC A:  Vent dyshrony, change in MS P:   If not awake and nonfocal after ETT , will need head CT fent addition  TODAY'S SUMMARY: neck cancer, asp vs hcap, vent, volume, line, neo, SS protocol  I have personally obtained a history, examined the patient, evaluated laboratory and imaging results, formulated the assessment and plan and placed orders. CRITICAL CARE: The patient is critically ill with multiple organ systems failure and requires high complexity decision making for assessment and support, frequent evaluation and titration of therapies, application of advanced monitoring technologies and extensive interpretation of multiple databases. Critical Care Time devoted to patient care services described in this note is 40 minutes.   I have had extensive discussions with family brother and to be fiance ( both reported to be Health care POA). We discussed patients current circumstances and  organ failures. We also discussed patient's prior wishes under circumstances such as this. Family has decided to NOT perform resuscitation if arrest but to continue current medical support for now.   Lavon Paganini. Titus Mould, MD, FACP Pgr: [facility]  Pulmonary and [place] Pager: [phone]  05/23/2013, 4:17 PM

## 2013-06-11 ENCOUNTER — Encounter: Payer: Self-pay | Admitting: Radiation Oncology

## 2013-06-11 ENCOUNTER — Ambulatory Visit: Payer: Medicare Other

## 2013-06-11 ENCOUNTER — Encounter (HOSPITAL_COMMUNITY): Payer: Self-pay

## 2013-06-11 ENCOUNTER — Ambulatory Visit (HOSPITAL_COMMUNITY): Payer: Medicare Other

## 2013-06-11 ENCOUNTER — Inpatient Hospital Stay (HOSPITAL_COMMUNITY): Payer: Medicare Other

## 2013-06-11 DIAGNOSIS — J9601 Acute respiratory failure with hypoxia: Secondary | ICD-10-CM

## 2013-06-11 DIAGNOSIS — C801 Malignant (primary) neoplasm, unspecified: Secondary | ICD-10-CM

## 2013-06-11 LAB — URINALYSIS, ROUTINE W REFLEX MICROSCOPIC
Bilirubin Urine: NEGATIVE
Glucose, UA: NEGATIVE mg/dL
HGB URINE DIPSTICK: NEGATIVE
Ketones, ur: NEGATIVE mg/dL
Nitrite: NEGATIVE
Protein, ur: NEGATIVE mg/dL
Specific Gravity, Urine: 1.023 (ref 1.005–1.030)
Urobilinogen, UA: 1 mg/dL (ref 0.0–1.0)
pH: 6.5 (ref 5.0–8.0)

## 2013-06-11 LAB — HEPATIC FUNCTION PANEL
ALK PHOS: 87 U/L (ref 39–117)
ALT: 25 U/L (ref 0–53)
AST: 21 U/L (ref 0–37)
Albumin: 2.5 g/dL — ABNORMAL LOW (ref 3.5–5.2)
BILIRUBIN DIRECT: 0.3 mg/dL (ref 0.0–0.3)
BILIRUBIN INDIRECT: 0.5 mg/dL (ref 0.3–0.9)
BILIRUBIN TOTAL: 0.8 mg/dL (ref 0.3–1.2)
TOTAL PROTEIN: 5.3 g/dL — AB (ref 6.0–8.3)

## 2013-06-11 LAB — URINE MICROSCOPIC-ADD ON

## 2013-06-11 LAB — BLOOD GAS, ARTERIAL
Acid-Base Excess: 1.8 mmol/L (ref 0.0–2.0)
BICARBONATE: 24.4 meq/L — AB (ref 20.0–24.0)
DRAWN BY: 317871
FIO2: 0.4 %
LHR: 18 {breaths}/min
MECHVT: 530 mL
O2 SAT: 97 %
PCO2 ART: 33.8 mmHg — AB (ref 35.0–45.0)
PEEP: 5 cmH2O
PO2 ART: 86.1 mmHg (ref 80.0–100.0)
Patient temperature: 37.8
TCO2: 21.8 mmol/L (ref 0–100)
pH, Arterial: 7.475 — ABNORMAL HIGH (ref 7.350–7.450)

## 2013-06-11 LAB — CBC
HCT: 35.6 % — ABNORMAL LOW (ref 39.0–52.0)
Hemoglobin: 11.8 g/dL — ABNORMAL LOW (ref 13.0–17.0)
MCH: 31 pg (ref 26.0–34.0)
MCHC: 33.1 g/dL (ref 30.0–36.0)
MCV: 93.4 fL (ref 78.0–100.0)
PLATELETS: 196 10*3/uL (ref 150–400)
RBC: 3.81 MIL/uL — ABNORMAL LOW (ref 4.22–5.81)
RDW: 13.9 % (ref 11.5–15.5)
WBC: 10.1 10*3/uL (ref 4.0–10.5)

## 2013-06-11 LAB — BASIC METABOLIC PANEL
BUN: 17 mg/dL (ref 6–23)
CALCIUM: 9.3 mg/dL (ref 8.4–10.5)
CO2: 24 mEq/L (ref 19–32)
CREATININE: 0.68 mg/dL (ref 0.50–1.35)
Chloride: 106 mEq/L (ref 96–112)
GFR calc Af Amer: >90 mL/min (ref >90)
GFR, EST NON AFRICAN AMERICAN: 88 mL/min — AB (ref >90)
Glucose, Bld: 99 mg/dL (ref 70–99)
Potassium: 3.6 mEq/L — ABNORMAL LOW (ref 3.7–5.3)
SODIUM: 139 meq/L (ref 137–147)

## 2013-06-11 LAB — GLUCOSE, CAPILLARY
GLUCOSE-CAPILLARY: 99 mg/dL (ref 70–99)
Glucose-Capillary: 98 mg/dL (ref 70–99)
Glucose-Capillary: 82 mg/dL (ref 70–99)
Glucose-Capillary: 94 mg/dL (ref 70–99)
Glucose-Capillary: 97 mg/dL (ref 70–99)
Glucose-Capillary: 77 mg/dL (ref 70–99)

## 2013-06-11 LAB — CORTISOL: Cortisol, Plasma: 27.2 ug/dL

## 2013-06-11 LAB — LACTIC ACID, PLASMA: LACTIC ACID, VENOUS: 0.9 mmol/L (ref 0.5–2.2)

## 2013-06-11 LAB — PHOSPHORUS: PHOSPHORUS: 1.4 mg/dL — AB (ref 2.3–4.6)

## 2013-06-11 LAB — TROPONIN I: Troponin I: <0.3 ng/mL (ref <0.30)

## 2013-06-11 LAB — MAGNESIUM: MAGNESIUM: 1.8 mg/dL (ref 1.5–2.5)

## 2013-06-11 MED ORDER — CHLORHEXIDINE GLUCONATE 0.12 % MT SOLN
15.0000 mL | Freq: Two times a day (BID) | OROMUCOSAL | Status: DC
  Administered 2013-06-11 – 2013-06-13 (×5): 15 mL via OROMUCOSAL
  Filled 2013-06-11 (×5): qty 15

## 2013-06-11 MED ORDER — BIOTENE DRY MOUTH MT LIQD
15.0000 mL | Freq: Four times a day (QID) | OROMUCOSAL | Status: DC
  Administered 2013-06-11 – 2013-06-13 (×8): 15 mL via OROMUCOSAL

## 2013-06-11 MED ORDER — SODIUM CHLORIDE 0.9 % IV SOLN
500.0000 mg | Freq: Two times a day (BID) | INTRAVENOUS | Status: DC
  Administered 2013-06-11 – 2013-06-12 (×3): 500 mg via INTRAVENOUS
  Filled 2013-06-11 (×4): qty 5

## 2013-06-11 MED ORDER — ENOXAPARIN SODIUM 40 MG/0.4ML ~~LOC~~ SOLN
40.0000 mg | SUBCUTANEOUS | Status: DC
  Administered 2013-06-11: 40 mg via SUBCUTANEOUS
  Filled 2013-06-11 (×2): qty 0.4

## 2013-06-11 MED ORDER — FAMOTIDINE IN NACL 20-0.9 MG/50ML-% IV SOLN
20.0000 mg | Freq: Two times a day (BID) | INTRAVENOUS | Status: DC
  Administered 2013-06-11 – 2013-06-12 (×4): 20 mg via INTRAVENOUS
  Filled 2013-06-11 (×5): qty 50

## 2013-06-11 MED ORDER — LORAZEPAM 2 MG/ML IJ SOLN: 1.0000 mg | INTRAMUSCULAR | Status: DC | PRN

## 2013-06-11 NOTE — Progress Notes (Signed)
Palliative Medicine consult received. I have seen and evaluated Mr. Schloemer this evening and reviewed his records in detail. He is intubated, but alert and communicating around the ETT tube. Family is not at bedside. He is as comfortable as possible given his current situation and circumstances. For now I have re-started his seizure medications that he was on at home (Keppra) to avoid seizure in the setting of known perineural tumor extension and meningioma. Additionally I have added Ativan PRN for sleep and sedation. I will probably recommend starting decadron to reduce tumor edema and  extrinsic compression prior to extubation if that is the direction that the family and patient want to go in terms of goals of care.  I will arrange to meet with family tomorrow to discuss option for transition to comfort care - consensus seems to be that he has a very poor prognosis. He is most appropriate for Hospice Care.  Lane Hacker, DO Palliative Medicine

## 2013-06-11 NOTE — Telephone Encounter (Signed)
S/W PATIENT SON-IN-LAW AND [facility] NEW PATIETN APPT FOR 03/31 @ 10:45 W/DR. Waelder

## 2013-06-11 NOTE — Plan of Care (Signed)
Contact Information:   Melissa Noon  [phone] (cell) [phone] (home)  Lissa Hoard (son in law)- has asked to be primary contact and will relay info to other family members [phone]  Bj Morlock  [phone] (staying at [place] [facility])

## 2013-06-11 NOTE — Progress Notes (Signed)
PULMONARY / CRITICAL CARE MEDICINE   Name: Wyatt [name] MRN: [id number] DOB: April 05, 1930    ADMISSION DATE:  06/13/2013 CONSULTATION DATE:  05/21/2013  REFERRING MD :  Mingo Amber, MD PRIMARY SERVICE: PCCM  CHIEF COMPLAINT:  Resp failure  BRIEF PATIENT DESCRIPTION:  80 yr old recurrent neck cancer, Aspiration PNA, Sepsis, Vented  SIGNIFICANT EVENTS / STUDIES:  3/25 - ETT, Pressors  LINES / TUBES: Left IJ 3/25>>> ETT 3/25>>>  CULTURES: 3/25 BC>>> 3/25 Sputum>>>  ANTIBIOTICS: 3/25 Vanc>>> 3/25 Iminem>>>  HISTORY OF PRESENT ILLNESS:  80 yr old WM with recurrent  neck cancer, PEG, reasonably functional, presented with resp failure. Has had radiation and by radiation oncology note poor prognosis . Unclear about chemo at this stage, family said it is planned but not done. Recent PEG placed Friday and reported as high asp risk. IN ED , confused, hypoxic. ETT placed. Drop in BP, neo started, volume given. Called to admit. Also noted was esophageal extrinsic compression from family.   SUBJECTIVE:  He is on 10/5 with adequate  resp rate Vti 600 cc.  VITAL SIGNS: Temp:  [99.9 F (37.7 C)-101.8 F (38.8 C)] 100 F (37.8 C) (03/26 0800) Pulse Rate:  [66-122] 78 (03/26 0900) Resp:  [12-31] 16 (03/26 0900) BP: (43-220)/(28-106) 121/54 mmHg (03/26 0900) SpO2:  [90 %-100 %] 98 % (03/26 0900) FiO2 (%):  [40 %-100 %] 40 % (03/26 0800) Weight:  [80.287 kg (177 lb)] 80.287 kg (177 lb) (03/25 1520) HEMODYNAMICS: CVP:  [8 mmHg-15 mmHg] 10 mmHg VENTILATOR SETTINGS: Vent Mode:  [-] CPAP;PSV FiO2 (%):  [40 %-100 %] 40 % Set Rate:  [14 bmp-18 bmp] 18 bmp Vt Set:  [530 mL] 530 mL PEEP:  [5 cmH20] 5 cmH20 Pressure Support:  [5 cmH20-10 cmH20] 10 cmH20 Plateau Pressure:  [15 cmH20-20 cmH20] 18 cmH20 INTAKE / OUTPUT: Intake/Output     03/25 0701 - 03/26 0700 03/26 0701 - 03/27 0700   I.V. (mL/kg) 2996.5 (37.3) 150 (1.9)   IV Piggyback 2400 200   Total Intake(mL/kg) 5396.5 (67.2) 350 (4.4)    Urine (mL/kg/hr) 600 110 (0.5)   Total Output 600 110   Net +4796.5 +240          PHYSICAL EXAMINATION: General:  Follows commands on low dose sedation Neuro:  MAE x4, follows commands HEENT:  Large mass rt neck Cardiovascular:  s1 s2 rrt Lungs:  Mild rhonchi, decreased bs bases Abdomen:  Soft,. bs peg,  Musculoskeletal:  Edema no Skin:  No rash, warm  LABS:  CBC  Recent Labs Lab 06/02/2013 1505 05/29/2013 1704 06/11/13 0454  WBC 18.6* 12.1* 10.1  HGB 15.2 12.5* 11.8*  HCT 44.5 37.5* 35.6*  PLT 276 233 196   Coag's No results found for this basename: APTT, INR,  in the last 168 hours BMET  Recent Labs Lab 06/08/2013 1505 05/18/2013 1704 06/11/13 0454  NA 135* 138 139  K 6.0* 4.2 3.6*  CL 96 101 106  CO2 28 27 24   BUN 21 21 17   CREATININE 0.73 0.81 0.68  GLUCOSE 172* 159* 99   Electrolytes  Recent Labs Lab 05/28/2013 1505 05/17/2013 1704 06/11/13 0454  CALCIUM 10.1 8.6 9.3  MG  --   --  1.8  PHOS  --   --  1.4*   Sepsis Markers  Recent Labs Lab 05/22/2013 1518 05/24/2013 1701  LATICACIDVEN 2.50* 2.1   ABG  Recent Labs Lab 05/23/2013 1606 06/11/13 0352  PHART 7.332* 7.475*  PCO2ART 53.9* 33.8*  PO2ART 250.0* 86.1   Liver Enzymes  Recent Labs Lab July 10, 2013 1505 10-Jul-2013 1704  AST 64* 29  ALT 46 30  ALKPHOS 112 91  BILITOT 0.7 0.8  ALBUMIN 3.5 2.6*   Cardiac Enzymes  Recent Labs Lab 10-Jul-2013 1704 Jul 10, 2013 2232 06/11/13 0454  TROPONINI <0.30 <0.30 <0.30   Glucose  Recent Labs Lab 10-Jul-2013 2037 06/11/13 0125 06/11/13 0333 06/11/13 0730  GLUCAP 143* 98 99 82    Imaging Dg Chest Port 1 View  06/11/2013   CLINICAL DATA:  Pneumonia  EXAM: PORTABLE CHEST - 1 VIEW  COMPARISON:  July 10, 2013  FINDINGS: An endotracheal tube is again seen 2.9 cm above the carina. A left central venous line is again seen projecting over the aortic knob. This is likely related to patient rotation. Patchy bibasilar atelectatic changes are again seen. No new focal  abnormality is noted.  IMPRESSION: Bibasilar changes.  Tubes and lines as described.   Electronically Signed   By: Inez Catalina M.D.   On: 06/11/2013 07:37   Dg Chest Port 1 View  10-Jul-2013   CLINICAL DATA:  Central line placement.  EXAM: PORTABLE CHEST - 1 VIEW  COMPARISON:  10-Jul-2013 at 1518 hr  FINDINGS: New left internal jugular central venous line has been placed. The tip projects within the left brachiocephalic vein. It does not appear to enter the superior vena cava.  No pneumothorax.  Endotracheal tube tip is stable lying 2.5 cm above the chronic, well positioned.  Patchy perihilar and medial lung base opacity, greater on the left, is unchanged.  IMPRESSION: 1. New left internal jugular central venous line has its tip projecting in the left break the spot vein. No pneumothorax. No other change from the earlier study.   Electronically Signed   By: Lajean Manes M.D.   On: Jul 10, 2013 17:22   Dg Chest Port 1 View  07-10-2013   CLINICAL DATA:  Respiratory distress status post endotracheal placement  EXAM: PORTABLE CHEST - 1 VIEW  COMPARISON:  None.  FINDINGS: An endotracheal tube is noted 4.6 cm above the carina. Cardiac shadow is within normal limits. Increased density is noted in the left retrocardiac region which may represent focal infiltrate.  IMPRESSION: Left retrocardiac infiltrate. Followup films to resolution are recommended.   Electronically Signed   By: Inez Catalina M.D.   On: July 10, 2013 15:44     CXR: left side infiltrate, ett wnl  ASSESSMENT / PLAN:  PULMONARY A:Acute resp failure, PNA, aspiration  likely (reported vomiting, poor airway control) P:   Vent bundle Follow abg May extubate following ct but large concern over secretions control.  CARDIOVASCULAR A: Septic shock, off pressors 3/26 P:  Repeat lactic(ordered 3/26) Cortisol(28) cvp 13 cvp goal 12 Vasopressors as needed  RENAL A:  At risk ATN but nl creatine P:   Volume resuscitation      GASTROINTESTINAL A:  Esophageal compression, dysphagia per family from squam neck  mass P:   ppi Npo Will need further radiation from mass neck  Peg in place   HEMATOLOGIC A:  squamous cell cancer neck, dvt risk P:  Sub q hep will change to lovenox in setting cancer Cbc as needed scd  INFECTIOUS A:  Asp PNA, hcap, immune suppression to some extent, family does NOT report all pcn P:   See flows ENDOCRINE A:  R/o rel AI P:   Cortisol(28) cbg  NEUROLOGIC A:  Rt meningoma  P:   Head Ct with rt meningoma Awake and alert. No  neuro reason not to extubate    TODAY'S SUMMARY:  May be weanable but concern is neck mass may be compressing esophagus and lead to aspiration. He has copious thick yellow oral and glottic secretions.   Richardson Landry Minor ACNP [facility] PCCM Pager [phone] till 3 pm If no answer page [phone] 06/11/2013, 9:41 AM   STAFF NOTE: I, Dr Ann Lions have personally reviewed patient's available data, including medical history, events of note, physical examination and test results as part of my evaluation. I have discussed with resident/NP and other care providers such as pharmacist, RN and RRT.  In addition,  I personally evaluated patient and elicited key findings of acute resp failure. He does SBT but we are concerned he will fail extubation due to upper airway obstruction for head and neck cancer or inability to clear secretions due to same.  Will have Rad Onc opine on prognosis before we approach patient/family about trach v extubation  Rest per NP/medical resident whose note is outlined above and that I agree with  The patient is critically ill with multiple organ systems failure and requires high complexity decision making for assessment and support, frequent evaluation and titration of therapies, application of advanced monitoring technologies and extensive interpretation of multiple databases.   Critical Care Time devoted to patient care services  described in this note is  30  Minutes.  Dr. Brand Males, M.D., [facility].C.P Pulmonary and Critical Care Medicine Staff Physician [facility] Pager: [phone], If no answer or between  15:00h - 7:00h: call [phone]  06/11/2013 11:14 AM

## 2013-06-11 NOTE — Progress Notes (Signed)
Radiation Oncology  Chart note: Mr. 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 He has thus far received 2 fractions of radiation therapy of a prescribed 33 fractions. He has involvement of cranial nerves VII, V3, and now X. Earlier this week, I spoke with Dr. Carlis Abbott, his head and neck surgeon from [place], and he told me that he would be at increased risk for dysphagia and aspiration. The likelihood for reversal of his cranial nerve deficits even with a full course of radiation therapy would be extremely low, at least in the short-term. The likelihood for cure with the proposed radiation therapy is also extremely low. The patient and his family are aware of his poor prognosis.

## 2013-06-11 NOTE — Progress Notes (Signed)
RT assisted with transporting PT to [facility] CT from [facility] ICU 1240. PT was transported to and fro on 100% Fi02 and FS settings. Transport to and fro was uneventful. PT returned to [facility] ICU 1240 and placed back on CPAP PS (10/5 and 40%).

## 2013-06-11 NOTE — Significant Event (Signed)
Spoke at length with Dr. Valere Dross. Prognosis is grim(<5% chance of recovery) even with tracheostomy and months of Rtx therapy. Dr. Valere Dross reports Mr. Hoos stated he did not want tracheostomy but did want comfort care if this situation arose.  Spoke with patient and fiance and they were acceptable of comfort care and extubation. We will consult palliative care for there expertise in end of life planning.   Goal of care will hopefully be comfort with dignity.  Note spoke with entire family earlier for >40 minutes.    BP 153/70  Pulse 87  Temp(Src) 99.9 F (37.7 C) (Core (Comment))  Resp 16  Ht 5\' 7"  (1.702 m)  Wt 80.287 kg (177 lb)  BMI 27.72 kg/m2  SpO2 96%  Recent Labs Lab 05/25/2013 1505 05/30/2013 1704 06/11/13 0454  NA 135* 138 139  K 6.0* 4.2 3.6*  CL 96 101 106  CO2 28 27 24   BUN 21 21 17   CREATININE 0.73 0.81 0.68  GLUCOSE 172* 159* 99    Recent Labs Lab 06/12/2013 1505 06/02/2013 1704 06/11/13 0454  HGB 15.2 12.5* 11.8*  HCT 44.5 37.5* 35.6*  WBC 18.6* 12.1* 10.1  PLT 276 233 196      Steve Bernadetta Roell ACNP [facility] PCCM Pager [phone] till 3 pm If no answer page [phone] 06/11/2013, 12:53 PM

## 2013-06-11 NOTE — Progress Notes (Signed)
INITIAL NUTRITION ASSESSMENT  Pt meets criteria for severe MALNUTRITION in the context of chronic illness as evidenced by <75% estimated energy intake with 17.7% weight loss in the past month per son's report.  DOCUMENTATION CODES Per approved criteria  -Severe malnutrition in the context of chronic illness   INTERVENTION: - If pt unable to be extubated in the next 24-48 hours, recommend TF initiation via PEG of Jevity 1.5 start at 20ml/hr increase by 10ml every 4 hours to goal of 50ml/hr with Prostat 30ml BID which will provide 2000 calories, 107g protein, and 912ml free water and will meet 99% estimated calorie needs and 100% of estimated protein needs - Will continue to monitor   NUTRITION DIAGNOSIS: Inadequate oral intake related to inability to eat as evidenced by NPO, mechanical ventilation.   Goal: TF initiation with goal to meet >90% of estimated nutritional needs  Monitor:  Weights, labs, TF initiation, vent status  Reason for Assessment: Ventilated, consult for TF recommendations  80 y.o. male  Admitting Dx: Respiratory failure   ASSESSMENT: Pt with metastatic squamous cell CA, getting radiation therapy that started Monday PTA (has received 2 of 33 fractions of radiation therapy), had recent placement of PEG tube on Friday PTA. Admitted with altered mental status and respiratory distress which has been ongoing for the past 2 months. Pt had gurgling respirations in ED and pt intubated. Had copious purulent secretions s/p intubation.    Wife requested RD talk with son, Clay, for diet history. Met with son who reports pt was getting 6 cans of Jevity 1.5 daily (1.5 cans 4 times/day) and 50ml of water flushes before and after each bolus administration and an additional 240ml water flush 2-3 times/day. Son reports pt has lost over 20 pounds in the past 3-4 weeks. States pt was tolerating TF at home, no c/o nausea, vomiting, or diarrhea except on day he called ED he found pt slumped  over vomiting up what appeared to be TF. Pt does not eat/drink by mouth ever since he had barium swallow evaluation and was told that he had 75% obstruction. Son reports he would ensure that pt sitting upright while getting TF and for 45 minutes afterwards.   Patient is currently intubated on ventilator support.  MV: 11 L/min Temp (24hrs), Avg:100.6 F (38.1 C), Min:99.9 F (37.7 C), Max:101.8 F (38.8 C)  Propofol: off  Home TF: Jevity 1.5 (1.5 cans QID) provided 2130 calories, 91g protein, 1080ml free water   Flushes: 50ml before and after each bolus TF administration and 260ml at least twice daily provided 920ml water  Height: Ht Readings from Last 1 Encounters:  05/21/2013 5' 7" (1.702 m)    Weight: Wt Readings from Last 1 Encounters:  06/07/2013 177 lb (80.287 kg)    Ideal Body Weight: 148 lbs   % Ideal Body Weight: 119%  Wt Readings from Last 10 Encounters:  06/03/2013 177 lb (80.287 kg)  06/08/13 177 lb 4.8 oz (80.423 kg)    Usual Body Weight: 215-220 lbs 3-4 weeks ago per son   % Usual Body Weight: 80-82%  BMI:  Body mass index is 27.72 kg/(m^2).  Estimated Nutritional Needs: Kcal: 2026 Protein: 90-110g Fluid: >2L/day  Skin: +1 RLE, LLE edema, generalized edema  Diet Order:  NPO  EDUCATION NEEDS: -No education needs identified at this time   Intake/Output Summary (Last 24 hours) at 06/11/13 1045 Last data filed at 06/11/13 1033  Gross per 24 hour  Intake 5921.51 ml  Output      810 ml  Net 5111.51 ml    Last BM: PTA  Labs:   Recent Labs Lab 05/20/2013 1505 05/24/2013 1704 06/11/13 0454  NA 135* 138 139  K 6.0* 4.2 3.6*  CL 96 101 106  CO2 _0 BUN _1 CREATININE 0.73 0.81 0.68  CALCIUM 10.1 8.6 9.3  MG  --   --  1.8  PHOS  --   --  1.4*  GLUCOSE 172* 159* 99    CBG (last 3)   Recent Labs  06/11/13 0125 06/11/13 0333 06/11/13 0730  GLUCAP 98 99 82    Scheduled Meds: . antiseptic oral rinse  15 mL Mouth Rinse QID  .  aspirin  324 mg Oral NOW   Or  . aspirin  300 mg Rectal NOW  . chlorhexidine  15 mL Mouth Rinse BID  . enoxaparin (LOVENOX) injection  40 mg Subcutaneous Q24H  . famotidine (PEPCID) IV  20 mg Intravenous Q12H  . fentaNYL  50 mcg Intravenous Once  . imipenem-cilastatin  500 mg Intravenous Q6H  . insulin aspart  0-9 Units Subcutaneous 6 times per day  . vancomycin  1,000 mg Intravenous Q12H    Continuous Infusions: . sodium chloride 1,000 mL (05/26/2013 1517)  . sodium chloride 75 mL/hr at 06/11/13 0600  . fentaNYL infusion INTRAVENOUS Stopped (06/11/13 1030)  . norepinephrine (LEVOPHED) Adult infusion Stopped (06/11/13 0501)  . phenylephrine (NEO-SYNEPHRINE) Adult infusion Stopped (06/04/2013 1930)    Past Medical History  Diagnosis Date  . Bell's palsy   . Hypertension   . Chronic kidney disease   . Squamous cell carcinoma 04/23/13    right lower neck mass  . Arthritis   . Paralytic ptosis   . Cervicalgia   . Hx of radiation therapy 07/28/12- 09/03/12    5000 cGy right cheek, 25 fractions    Past Surgical History  Procedure Laterality Date  . Eyelid surgery Right     platinum weight placement/removal  . Resection right cheek mass Right 07/17/2012  . Back surgery  [place], [place], [place] Pager [phone] After Hours Pager

## 2013-06-11 NOTE — Progress Notes (Signed)
Pt remains intubated and on a fentanyl infusion at 64mcg/hr. One PRN bolus of fentanyl given when RT was attempting to art stick. Pt showed signs of hemodynamic instability overnight requiring two separate 1L NS boluses per PRN order (to keep CVP >/= to 10). Levophed was started and stopped several times overnight due to MAPs <65 (goal to keep MAP >65). Despite boluses and intermittent use of pressors, pt's BP tends to drops sporadically and will then rebound. BP changes do not seem to correlate with agitation and have not been related to sedation. Lowest BP seen was approx 50/30. Several manual pressures were taken (refer to flowsheet) that correlated with automatic pressures.  So far, it appears that patient has received a total of 4L NS bolus since admission (two in ED and two in ICU). Vanc 1000mg  was given in ED and pharmacist asked to give another 1000mg  as a 2G total load. This was completed in ICU at beginning of shift.   HR has been stable despite patient being febrile at the beginning of the shift (Tmax 38.8). An acetaminophen suspension was ordered PRN for fever and one dose was administered via PEG tube. Pt now afebrile.   At the beginning of the shift, the patient was able to open eyes on command and follow simple directions (squeezing hands bilaterally and wiggling toes). Around 03:30, pt woke and was interactive, following more complex commands, and asking to spell out words in an effort to communicate. The patient asked appropriate questions and seemed to retain data that was given to him. He has asked about the reason for his admission, how long he will have to stay, where his family is, etc. He was able to ID family members' relation to him when given their name. He reached for his tube once but after education on not pulling at lines, he has made no further attempt.   Foley catheter, triple lumen CVL, ETT, and two PIVs in place. SCDs on patient.   Will continue to monitor closely and call  family to provide an update before the end of the shift.

## 2013-06-11 NOTE — Plan of Care (Signed)
Brother called and updated. Attempted to call and update Lissa Hoard and [name] but received no answer.

## 2013-06-11 NOTE — Progress Notes (Signed)
UR completed. Annalucia Laino RN CCM Case Mgmt phone [phone] 

## 2013-06-11 NOTE — Plan of Care (Signed)
Problem: ICU Phase Progression Outcomes Goal: Hemodynamically stable Outcome: Not Progressing Hemodynamic instability overnight. Please refer to progress note written by RN for details Goal: Voiding-avoid urinary catheter unless indicated Outcome: Not Progressing Urinary catheter in place

## 2013-06-12 ENCOUNTER — Inpatient Hospital Stay (HOSPITAL_COMMUNITY): Payer: Medicare Other

## 2013-06-12 ENCOUNTER — Encounter: Payer: Self-pay | Admitting: *Deleted

## 2013-06-12 ENCOUNTER — Ambulatory Visit: Admit: 2013-06-12 | Payer: Medicare Other

## 2013-06-12 ENCOUNTER — Ambulatory Visit: Admission: RE | Admit: 2013-06-12 | Payer: Medicare Other | Source: Ambulatory Visit

## 2013-06-12 DIAGNOSIS — C801 Malignant (primary) neoplasm, unspecified: Secondary | ICD-10-CM

## 2013-06-12 DIAGNOSIS — Z515 Encounter for palliative care: Secondary | ICD-10-CM

## 2013-06-12 DIAGNOSIS — J9600 Acute respiratory failure, unspecified whether with hypoxia or hypercapnia: Secondary | ICD-10-CM

## 2013-06-12 LAB — GLUCOSE, CAPILLARY
GLUCOSE-CAPILLARY: 75 mg/dL (ref 70–99)
GLUCOSE-CAPILLARY: 116 mg/dL — AB (ref 70–99)
Glucose-Capillary: 74 mg/dL (ref 70–99)
Glucose-Capillary: 92 mg/dL (ref 70–99)

## 2013-06-12 LAB — BASIC METABOLIC PANEL
BUN: 14 mg/dL (ref 6–23)
CALCIUM: 9.3 mg/dL (ref 8.4–10.5)
CO2: 23 mEq/L (ref 19–32)
CREATININE: 0.59 mg/dL (ref 0.50–1.35)
Chloride: 104 mEq/L (ref 96–112)
GFR calc non Af Amer: >90 mL/min (ref >90)
Glucose, Bld: 88 mg/dL (ref 70–99)
Potassium: 3.4 mEq/L — ABNORMAL LOW (ref 3.7–5.3)
Sodium: 138 mEq/L (ref 137–147)

## 2013-06-12 LAB — CBC
HEMATOCRIT: 35.9 % — AB (ref 39.0–52.0)
Hemoglobin: 12.2 g/dL — ABNORMAL LOW (ref 13.0–17.0)
MCH: 31.3 pg (ref 26.0–34.0)
MCHC: 34 g/dL (ref 30.0–36.0)
MCV: 92.1 fL (ref 78.0–100.0)
Platelets: 194 10*3/uL (ref 150–400)
RBC: 3.9 MIL/uL — ABNORMAL LOW (ref 4.22–5.81)
RDW: 13.7 % (ref 11.5–15.5)
WBC: 9.6 10*3/uL (ref 4.0–10.5)

## 2013-06-12 LAB — URINE CULTURE
CULTURE: NO GROWTH
Colony Count: NO GROWTH

## 2013-06-12 LAB — MAGNESIUM: Magnesium: 1.9 mg/dL (ref 1.5–2.5)

## 2013-06-12 LAB — PHOSPHORUS: Phosphorus: 1.9 mg/dL — ABNORMAL LOW (ref 2.3–4.6)

## 2013-06-12 MED ORDER — DEXTROSE 5 % IV SOLN
20.0000 mmol | Freq: Once | INTRAVENOUS | Status: AC
  Administered 2013-06-12: 20 mmol via INTRAVENOUS
  Filled 2013-06-12: qty 6.67

## 2013-06-12 MED ORDER — DEXAMETHASONE SODIUM PHOSPHATE 10 MG/ML IJ SOLN
10.0000 mg | Freq: Every day | INTRAMUSCULAR | Status: DC
  Administered 2013-06-12: 10 mg via INTRAVENOUS
  Filled 2013-06-12 (×2): qty 1

## 2013-06-12 MED ORDER — HYDRALAZINE HCL 20 MG/ML IJ SOLN
10.0000 mg | Freq: Four times a day (QID) | INTRAMUSCULAR | Status: DC | PRN
  Administered 2013-06-12 (×2): 20 mg via INTRAVENOUS
  Filled 2013-06-12: qty 1

## 2013-06-12 MED ORDER — MAGNESIUM SULFATE 40 MG/ML IJ SOLN
2.0000 g | Freq: Once | INTRAMUSCULAR | Status: AC
  Administered 2013-06-12: 2 g via INTRAVENOUS
  Filled 2013-06-12: qty 50

## 2013-06-12 MED ORDER — MORPHINE SULFATE 4 MG/ML IJ SOLN
4.0000 mg | INTRAMUSCULAR | Status: DC | PRN
  Administered 2013-06-12: 4 mg via INTRAVENOUS
  Filled 2013-06-12: qty 1

## 2013-06-12 MED ORDER — VITAMINS A & D EX OINT
TOPICAL_OINTMENT | CUTANEOUS | Status: AC
  Administered 2013-06-12: 1
  Filled 2013-06-12: qty 5

## 2013-06-12 MED ORDER — DIAZEPAM 5 MG/ML IJ SOLN
5.0000 mg | INTRAMUSCULAR | Status: DC | PRN
  Administered 2013-06-12 – 2013-06-13 (×2): 5 mg via INTRAVENOUS
  Filled 2013-06-12 (×2): qty 2

## 2013-06-12 MED ORDER — HYDRALAZINE HCL 20 MG/ML IJ SOLN
INTRAMUSCULAR | Status: AC
  Filled 2013-06-12: qty 1

## 2013-06-12 MED ORDER — DIAZEPAM 5 MG/ML IJ SOLN
2.5000 mg | Freq: Once | INTRAMUSCULAR | Status: AC
  Administered 2013-06-12: 2.5 mg via INTRAVENOUS
  Filled 2013-06-12: qty 2

## 2013-06-12 MED ORDER — IPRATROPIUM-ALBUTEROL 0.5-2.5 (3) MG/3ML IN SOLN
3.0000 mL | Freq: Four times a day (QID) | RESPIRATORY_TRACT | Status: DC
  Administered 2013-06-12 – 2013-06-14 (×6): 3 mL via RESPIRATORY_TRACT
  Filled 2013-06-12 (×7): qty 3

## 2013-06-12 MED ORDER — SODIUM CHLORIDE 0.9 % IV SOLN: 1000.0000 mL | INTRAVENOUS | Status: DC

## 2013-06-12 MED ORDER — FENTANYL BOLUS VIA INFUSION
50.0000 ug | INTRAVENOUS | Status: DC | PRN
  Filled 2013-06-12: qty 50

## 2013-06-12 NOTE — Progress Notes (Signed)
Chaplain provided spiritual support with pt and extended family.   Pt requesting to be married to fiance, Melissa Noon, prior to extubation.  Arnez and [name] have license in hand and had planned to be married last weekend.  In conversation with pt and with Dr. Delanna Ahmadi assessment, determined pt has capacity to speak for his wishes and to enter into marriage.   Performed ceremony of marriage at bedside.   Continued emotional and spiritual support with Hasson and extended family.    Will continue to follow, please page in significant changes or if needs arise.   Will file marriage license with register of deeds on Monday, March 30.    Deeply appreciative of consult and work by ICU staff, RNs, DO to help this family celebrate [place] and [name]'s relationship.    Jerene Pitch [place]

## 2013-06-12 NOTE — Progress Notes (Addendum)
PULMONARY / CRITICAL CARE MEDICINE   Name: Wyatt [name] MRN: [id number] DOB: May 14, 1930    ADMISSION DATE:  06/03/2013 CONSULTATION DATE:  06/09/2013  REFERRING MD :  Mingo Amber, MD PRIMARY SERVICE: PCCM  CHIEF COMPLAINT:  Resp failure  BRIEF PATIENT DESCRIPTION:    80 yr old WM with recurrent  neck cancer, PEG, reasonably functional, presented with resp failure. Has had radiation and by radiation oncology note poor prognosis . Unclear about chemo at this stage, family said it is planned but not done. Recent PEG placed Friday and reported as high asp risk. IN ED , confused, hypoxic. ETT placed. Drop in BP, neo started, volume given. Called to admit. Also noted was esophageal extrinsic compression from family.    SIGNIFICANT EVENTS / STUDIES:  3/25 - ETT, Pressors  LINES / TUBES: Left IJ 3/25>>> ETT 3/25>>>  CULTURES: 3/25 BC>>>neg 3/25 Sputum>>>  ANTIBIOTICS: 3/25 Vanc>>> 3/25 Iminem>>>   SUBJECTIVE:  He is on 10/5 with adequate  resp rate Vti 600 cc.   Staff note: off pressors. On low dose fent. Weaning well   VITAL SIGNS: Temp:  [98.2 F (36.8 C)-100.8 F (38.2 C)] 98.6 F (37 C) (03/27 0900) Pulse Rate:  [65-99] 80 (03/27 0900) Resp:  [16-23] 17 (03/27 0900) BP: (125-205)/(62-100) 168/70 mmHg (03/27 0806) SpO2:  [91 %-100 %] 96 % (03/27 0900) FiO2 (%):  [40 %] 40 % (03/27 0831) Weight:  [89.1 kg (196 lb 6.9 oz)-89.9 kg (198 lb 3.1 oz)] 89.9 kg (198 lb 3.1 oz) (03/27 0400) HEMODYNAMICS:   VENTILATOR SETTINGS: Vent Mode:  [-] PSV FiO2 (%):  [40 %] 40 % Set Rate:  [18 bmp] 18 bmp Vt Set:  [530 mL] 530 mL PEEP:  [5 cmH20] 5 cmH20 Pressure Support:  [10 cmH20] 10 cmH20 Plateau Pressure:  [10 cmH20-16 cmH20] 16 cmH20 INTAKE / OUTPUT: Intake/Output     03/26 0701 - 03/27 0700 03/27 0701 - 03/28 0700   I.V. (mL/kg) 1852.5 (20.6) 185 (2.1)   IV Piggyback 1000 200   Total Intake(mL/kg) 2852.5 (31.7) 385 (4.3)   Urine (mL/kg/hr) 1585 (0.7) 250 (0.9)   Total  Output 1585 250   Net +1267.5 +135          PHYSICAL EXAMINATION: General:  Follows commands on low dose sedation Neuro:  MAE x4, follows commands HEENT:  Large mass rt neck Cardiovascular:  s1 s2 rrt Lungs:  Mild rhonchi, decreased bs bases Abdomen:  Soft,. bs peg, no feeds at this time Musculoskeletal:  Edema no Skin:  No rash, warm  LABS:  PULMONARY  Recent Labs Lab 06/11/2013 1606 06/12/2013 1713 06/03/2013 2045 06/11/13 0352  PHART 7.332*  --   --  7.475*  PCO2ART 53.9*  --   --  33.8*  PO2ART 250.0*  --   --  86.1  HCO3 27.7*  --   --  24.4*  TCO2 25.0  --   --  21.8  O2SAT 99.1 70.6 72.6 97.0    CBC  Recent Labs Lab 06/09/2013 1704 06/11/13 0454 06/12/13 0500  HGB 12.5* 11.8* 12.2*  HCT 37.5* 35.6* 35.9*  WBC 12.1* 10.1 9.6  PLT 233 196 194    COAGULATION No results found for this basename: INR,  in the last 168 hours  [place] Lab 05/28/2013 1704 05/28/2013 2232 06/11/13 0454  TROPONINI <0.30 <0.30 <0.30   No results found for this basename: PROBNP,  in the last 168 hours   [place] Lab  05/24/2013 1505 05/17/2013 1704 06/11/13 0454 06/12/13 0500  NA 135* 138 139 138  K 6.0* 4.2 3.6* 3.4*  CL 96 101 106 104  CO2 28 27 24 23   GLUCOSE 172* 159* 99 88  BUN 21 21 17 14   CREATININE 0.73 0.81 0.68 0.59  CALCIUM 10.1 8.6 9.3 9.3  MG  --   --  1.8 1.9  PHOS  --   --  1.4* 1.9*   Estimated Creatinine Clearance: 78.8 ml/min (by C-G formula based on Cr of 0.59).   LIVER  Recent Labs Lab 05/27/2013 1505 06/12/2013 1704 06/11/13 1055  AST 64* 29 21  ALT 46 30 25  ALKPHOS 112 91 87  BILITOT 0.7 0.8 0.8  PROT 7.5 5.2* 5.3*  ALBUMIN 3.5 2.6* 2.5*     INFECTIOUS  Recent Labs Lab 06/11/2013 1518 05/22/2013 1701 06/11/13 1055  LATICACIDVEN 2.50* 2.1 0.9     ENDOCRINE CBG (last 3)   Recent Labs  06/12/13 0011 06/12/13 0426 06/12/13 0802  GLUCAP 92 75 74         IMAGING x48h  Ct Head Wo  Contrast  06/11/2013   CLINICAL DATA:  Altered mental status  EXAM: CT HEAD WITHOUT CONTRAST  TECHNIQUE: Contiguous axial images were obtained from the base of the skull through the vertex without intravenous contrast.  COMPARISON:  None.  FINDINGS: There is a metallic device overlying the right eyelid from recent surgery resulting in beam hardening artifact which limits evaluation of the brain on image 10. There is no evidence of mass effect, midline shift or extra-axial fluid collections. There is no evidence of intracranial hemorrhage. There is a 1.4 x 1.6 cm hyperdense right frontal extra-axial mass most consistent with a meningioma. There is no evidence of a cortical-based area of acute infarction.  The ventricles and sulci are appropriate for the patient's age. The basal cisterns are patent.  Visualized portions of the orbits are unremarkable. The visualized portions of the paranasal sinuses and mastoid air cells are unremarkable.  The osseous structures are unremarkable.  IMPRESSION: There is a metallic device overlying the right eyelid from recent surgery resulting in beam hardening artifact which limits evaluation of the brain on image 10.  1. No acute intracranial pathology. 2. Hyperdense right frontal extra-axial mass likely representing a meningioma. If there is further clinical concern this can be better characterized by MRI.   Electronically Signed   By: Kathreen Devoid   On: 06/11/2013 10:11   Dg Chest Port 1 View  06/12/2013   CLINICAL DATA:  Intubation.  EXAM: PORTABLE CHEST - 1 VIEW  COMPARISON:  DG CHEST 1V PORT dated 06/11/2013; DG CHEST 1V PORT dated 05/18/2013  FINDINGS: Endotracheal tube and left IJ line in stable position. Left IJ line again is seen projecting over the left upper chest along the aortic knob. Mediastinum hilar structures are unremarkable. Cardiomegaly unchanged. Perihilar and bibasilar atelectasis and/or infiltrate is noted. No pleural effusion or pneumothorax.  IMPRESSION: 1.  Line and tube positions unchanged. 2. Stable cardiomegaly. 3. Stable bilateral perihilar and basilar atelectasis and/or infiltrates.   Electronically Signed   By: Marcello Moores  Register   On: 06/12/2013 07:51   Dg Chest Port 1 View  06/11/2013   CLINICAL DATA:  Pneumonia  EXAM: PORTABLE CHEST - 1 VIEW  COMPARISON:  05/23/2013  FINDINGS: An endotracheal tube is again seen 2.9 cm above the carina. A left central venous line is again seen projecting over the aortic knob. This is likely related  to patient rotation. Patchy bibasilar atelectatic changes are again seen. No new focal abnormality is noted.  IMPRESSION: Bibasilar changes.  Tubes and lines as described.   Electronically Signed   By: Inez Catalina M.D.   On: 06/11/2013 07:37   Dg Chest Port 1 View  06/04/2013   CLINICAL DATA:  Central line placement.  EXAM: PORTABLE CHEST - 1 VIEW  COMPARISON:  05/29/2013 at 1518 hr  FINDINGS: New left internal jugular central venous line has been placed. The tip projects within the left brachiocephalic vein. It does not appear to enter the superior vena cava.  No pneumothorax.  Endotracheal tube tip is stable lying 2.5 cm above the chronic, well positioned.  Patchy perihilar and medial lung base opacity, greater on the left, is unchanged.  IMPRESSION: 1. New left internal jugular central venous line has its tip projecting in the left break the spot vein. No pneumothorax. No other change from the earlier study.   Electronically Signed   By: Lajean Manes M.D.   On: 05/26/2013 17:22   Dg Chest Port 1 View  06/09/2013   CLINICAL DATA:  Respiratory distress status post endotracheal placement  EXAM: PORTABLE CHEST - 1 VIEW  COMPARISON:  None.  FINDINGS: An endotracheal tube is noted 4.6 cm above the carina. Cardiac shadow is within normal limits. Increased density is noted in the left retrocardiac region which may represent focal infiltrate.  IMPRESSION: Left retrocardiac infiltrate. Followup films to resolution are recommended.    Electronically Signed   By: Inez Catalina M.D.   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 Unable to protect airway   - weaning but unclear if he can maintain extubation due to H&N cancer. On decadron P:   Vent bundle Follow abg May extubate to comfort care.; dependent on goals of care outcome with Dr Hilma Favors  CARDIOVASCULAR(resolved) A: Septic shock, off pressors 3/26 P:  Monitor MAP goal > 65   RENAL A:  At risk ATN but nl creatine P:   Volume resuscitation     GASTROINTESTINAL A:  Esophageal compression, dysphagia per family from squam neck  Mass. S.p chronic peg Per RTX oncology, extermely poor prognosis.  P:   ppi Npo Will need further radiation from mass neck . decadron    HEMATOLOGIC A:  squamous cell cancer neck, dvt risk     Poor prognosis  P:  Sub lovenox in setting cancer Cbc as needed scd    INFECTIOUS A:  Asp PNA, hcap, immune suppression to some extent, family does NOT report all pcn P:   See flows  ENDOCRINE A:  R/o rel AI, cortisol 28 P:    cbg  NEUROLOGIC A:  Rt meningoma on head cT P:   Head Ct with rt meningoma Awake and alert. Resumed keppra Started decadron 3/27  GLOBAL  NP 3/26: Prolonged discussion 3/26 as to poor prognosis. Palliative care help greatly appreciated. Questionable terminal wean with goal of comfort versus trach/continued rtx per family and to be revisited 3/27.   Richardson Landry Minor ACNP Maryanna Shape PCCM Pager [phone] till 3 pm If no answer page [phone] 06/12/2013, 9:59 AM   STAFF NOTE: I, Dr Ann Lions have personally reviewed patient's available data, including medical history, events of note, physical examination and test results as part of my evaluation. I have discussed with resident/NP and other  care providers such as pharmacist, RN and RRT.  In  addition,  I personally evaluated patient and elicited key findings of acute resp failure. Weaning well but extubation questionable due to neck mass. So goals of care desired. Palliative care to meet with family at 11.30am; I updated brother, niece, fiancee and her son  Rest per NP/medical resident whose note is outlined above and that I agree with  The patient is critically ill with multiple organ systems failure and requires high complexity decision making for assessment and support, frequent evaluation and titration of therapies, application of advanced monitoring technologies and extensive interpretation of multiple databases.   Critical Care Time devoted to patient care services described in this note is  35  Minutes.  Dr. Brand Males, M.D., [facility].C.P Pulmonary and Critical Care Medicine Staff Physician [facility] Pager: [phone], If no answer or between  15:00h - 7:00h: call [phone]  06/12/2013 10:57 AM

## 2013-06-12 NOTE — Procedures (Signed)
Extubation Procedure Note  Patient Details:   Name: Wyatt [name] DOB: 01/11/1931 MRN: [id number]   Airway Documentation:  Airway 8 mm (Active)  Secured at (cm) 25 cm 06/12/2013 12:16 PM  Measured From Lips 06/12/2013 12:16 PM  Secured Location Left 06/12/2013 12:16 PM  Secured By [employer] 06/12/2013 12:16 PM  Tube Holder Repositioned Yes 06/12/2013 11:22 AM  Cuff Pressure (cm H2O) 26 cm H2O 06/12/2013  7:33 AM  Site Condition Dry 06/12/2013 11:22 AM    Evaluation  O2 sats: stable throughout Complications: No apparent complications Patient did tolerate procedure well. Bilateral Breath Sounds: Rhonchi Suctioning: Airway Yes  Baldwin [place] Nannette 06/12/2013, 4:34 PM

## 2013-06-12 NOTE — Progress Notes (Signed)
Per Dr Charlton Amor e mail on 06/11/13 pt is on treatment break until further notice. Notified Miranda, RT, linac 4.

## 2013-06-12 NOTE — Clinical Documentation Improvement (Signed)
PCCM MD's, NP's, and PA's   Possible Clinical Conditions?  Per nutrition consult note patient meets criteria for malnutrition due to "Inadequate oral intake related to inability to eat as evidenced by NPO, mechanical ventilation" patient on Tube feeding due to intubation, BMI 27.72.  If either condition below is appropriate please document in notes. Thank You    Severe Malnutrition    Protein Calorie Malnutrition  Severe Protein Calorie Malnutrition   Other Condition  Cannot clinically determine   Risk Factors: ASP PNA, Acute Resp Failure, Septic Shock, Dysphagia 2/2 esophageal compression 2/2 squamous neck mass   Treatment: "TF initiation via PEG of Jevity 1.5 start at 46ml/hr increase by 39ml every 4 hours to goal of 33ml/hr with Prostat 16ml BID which will provide 2000 calories, 107g protein, and 918ml free water and will meet 99% estimated calorie needs and 100% of estimated protein needs   Thank You, Ree Kida ,RN Clinical Documentation Specialist:  [place] Information Management

## 2013-06-12 NOTE — Progress Notes (Addendum)
Met with patient and his brother in [place].  Pt alert but lethargic having been extubated a couple of hours ago.  Patient brother stated that Jenny Reichmann and [name] had exchanged vows earlier in the afternoon. Brother further stated family discussion has been hold of on further tmts (chemo, RT) at this time.  I later spoke with [name] who expressed concern for turn of events with regard to Arnett's health and the unexpected delay with his CA tmt.  I offered support.  Continuing navigation as L1 patient (new patient).  Gayleen Orem, RN, BSN, [facility] Head & Neck Oncology Navigator [phone]

## 2013-06-12 NOTE — Progress Notes (Signed)
Post extubation RT placed PT on 4lpm [place]- per Dr. Armandina Gemma. RN aware.

## 2013-06-12 NOTE — Progress Notes (Signed)
Met with family and patient.  1. DNR 2. Transition to full comfort to include extubation, sedation and pain control as needed, continue antibiotics for now. 3. Patient specifically requests to marry his fiancee prior to extubation-they were supposed to get married this weekend-the have a license. I have spoken to Chaplain who will visit family at 13PM. Patient has full capacity for decision making on my assessment today. Family wanting to uphold his wish- but I also sense that there may be some hesitation in formally getting married in terms of his life expectancy and the legal aspects of making this designation. 4. Extubate after marriage occurs- maintain comfort until those details can be worked out.  I will follow today.  Lane Hacker, DO Palliative Medicine

## 2013-06-13 ENCOUNTER — Encounter: Payer: Self-pay | Admitting: *Deleted

## 2013-06-13 DIAGNOSIS — E43 Unspecified severe protein-calorie malnutrition: Secondary | ICD-10-CM | POA: Insufficient documentation

## 2013-06-13 MED ORDER — SENNOSIDES-DOCUSATE SODIUM 8.6-50 MG PO TABS
2.0000 | ORAL_TABLET | Freq: Every day | ORAL | Status: DC
  Filled 2013-06-13: qty 2

## 2013-06-13 MED ORDER — ARTIFICIAL TEARS OP OINT
TOPICAL_OINTMENT | OPHTHALMIC | Status: DC | PRN
  Administered 2013-06-13: 19:00:00 via OPHTHALMIC
  Filled 2013-06-13: qty 3.5

## 2013-06-13 MED ORDER — SCOPOLAMINE 1 MG/3DAYS TD PT72
1.0000 | MEDICATED_PATCH | TRANSDERMAL | Status: DC
  Administered 2013-06-13: 1.5 mg via TRANSDERMAL
  Filled 2013-06-13: qty 1

## 2013-06-13 MED ORDER — IPRATROPIUM BROMIDE 0.02 % IN SOLN
0.5000 mg | RESPIRATORY_TRACT | Status: DC | PRN
Start: 2013-06-13 — End: 2013-06-14

## 2013-06-13 MED ORDER — GLYCOPYRROLATE 0.2 MG/ML IJ SOLN
0.1000 mg | Freq: Three times a day (TID) | INTRAMUSCULAR | Status: DC
  Administered 2013-06-13 – 2013-06-14 (×3): 0.1 mg via INTRAVENOUS
  Filled 2013-06-13 (×5): qty 0.5

## 2013-06-13 MED ORDER — DIAZEPAM 5 MG/ML IJ SOLN
5.0000 mg | INTRAMUSCULAR | Status: DC
  Administered 2013-06-13 – 2013-06-14 (×7): 5 mg via INTRAVENOUS
  Filled 2013-06-13 (×7): qty 2

## 2013-06-13 MED ORDER — SODIUM CHLORIDE 0.9 % IV SOLN
1.0000 mg/h | INTRAVENOUS | Status: DC
  Administered 2013-06-13 – 2013-06-14 (×3): 1 mg/h via INTRAVENOUS
  Filled 2013-06-13 (×3): qty 2.5

## 2013-06-13 MED ORDER — HYDROMORPHONE BOLUS VIA INFUSION
1.0000 mg | INTRAVENOUS | Status: DC | PRN
  Filled 2013-06-13: qty 1

## 2013-06-13 MED ORDER — ATROPINE SULFATE 1 % OP SOLN
2.0000 [drp] | Freq: Four times a day (QID) | OPHTHALMIC | Status: DC
  Administered 2013-06-13 – 2013-06-14 (×4): 2 [drp] via SUBLINGUAL
  Filled 2013-06-13: qty 2

## 2013-06-13 MED ORDER — ONDANSETRON HCL 4 MG/2ML IJ SOLN: 4.0000 mg | Freq: Four times a day (QID) | INTRAMUSCULAR | Status: DC | PRN

## 2013-06-13 NOTE — Progress Notes (Signed)
During the night pt became confused, however still denies pain. Pt reassured.  Family to be in this am.

## 2013-06-13 NOTE — Progress Notes (Signed)
PULMONARY / CRITICAL CARE MEDICINE   Name: [name] MRN: [id number] DOB: 05-02-1930    ADMISSION DATE:  06-24-2013 CONSULTATION DATE:  06/24/2013  REFERRING MD :  Mingo Amber, MD PRIMARY SERVICE: PCCM  CHIEF COMPLAINT:  Resp failure  BRIEF PATIENT DESCRIPTION:    80 yr old WM with recurrent  neck cancer, PEG, reasonably functional, presented with resp failure. Has had radiation and by radiation oncology note poor prognosis .  Recent PEG placed 3/20 and reported as high asp risk. IN ED , confused, hypoxic. ETT placed. Drop in BP, neo started, volume given. Called to admit. Also noted was esophageal extrinsic compression .    SIGNIFICANT EVENTS / STUDIES:  3/25 - ETT, Pressors 3/27 one way extubation after marriage service  LINES / TUBES: Left IJ 3/25>>> ETT 3/25>>> 3/27  CULTURES: 3/25 BC>>>neg 3/25 urine>>>ng  ANTIBIOTICS: 3/25 Vanc>>> 3/25 Iminem>>>   SUBJECTIVE:  Extubated  Appears in mild discomfort on fent gtt    VITAL SIGNS: Temp:  [97.2 F (36.2 C)-98.8 F (37.1 C)] 97.9 F (36.6 C) (03/28 1000) Pulse Rate:  [75-102] 89 (03/28 1000) Resp:  [9-25] 16 (03/28 1000) BP: (113-187)/(55-83) 170/71 mmHg (03/28 1000) SpO2:  [93 %-100 %] 93 % (03/28 1000) FiO2 (%):  [40 %] 40 % (03/27 1218) Weight:  [90.5 kg (199 lb 8.3 oz)] 90.5 kg (199 lb 8.3 oz) (03/28 0400) HEMODYNAMICS:   VENTILATOR SETTINGS: Vent Mode:  [-] PRVC FiO2 (%):  [40 %] 40 % Set Rate:  [18 bmp] 18 bmp Vt Set:  [530 mL] 530 mL PEEP:  [5 cmH20] 5 cmH20 Pressure Support:  [10 cmH20] 10 cmH20 Plateau Pressure:  [12 cmH20] 12 cmH20 INTAKE / OUTPUT: Intake/Output     03/27 0701 - 03/28 0700 03/28 0701 - 03/29 0700   I.V. (mL/kg) 907.5 (10) 60 (0.7)   IV Piggyback 1404    Total Intake(mL/kg) 2311.5 (25.5) 60 (0.7)   Urine (mL/kg/hr) 1400 (0.6) 230 (0.7)   Total Output 1400 230   Net +911.5 -170          PHYSICAL EXAMINATION: General:  Follows commands on low dose fent, able to interact with  family Neuro:  MAE x4, follows commands HEENT:  Large mass rt neck Cardiovascular:  s1 s2 rrt Lungs:  Mild rhonchi, decreased bs bases Abdomen:  Soft,. bs peg, no feeds at this time Musculoskeletal:  Edema no Skin:  No rash, warm  LABS:  PULMONARY  Recent Labs Lab 06-24-2013 1606 06/24/13 1713 06-24-2013 2045 06/11/13 0352  PHART 7.332*  --   --  7.475*  PCO2ART 53.9*  --   --  33.8*  PO2ART 250.0*  --   --  86.1  HCO3 27.7*  --   --  24.4*  TCO2 25.0  --   --  21.8  O2SAT 99.1 70.6 72.6 97.0    CBC  Recent Labs Lab 06/24/13 1704 06/11/13 0454 06/12/13 0500  HGB 12.5* 11.8* 12.2*  HCT 37.5* 35.6* 35.9*  WBC 12.1* 10.1 9.6  PLT 233 196 194    COAGULATION No results found for this basename: INR,  in the last 168 hours  CARDIAC    Recent Labs Lab 2013-06-24 1704 June 24, 2013 2232 06/11/13 0454  TROPONINI <0.30 <0.30 <0.30   No results found for this basename: PROBNP,  in the last 168 hours   CHEMISTRY  Recent Labs Lab 06-24-2013 1505 2013/06/24 1704 06/11/13 0454 06/12/13 0500  NA 135* 138 139 138  K 6.0* 4.2 3.6* 3.4*  CL 96 101 106 104  CO2 28 27 24 23   GLUCOSE 172* 159* 99 88  BUN 21 21 17 14   CREATININE 0.73 0.81 0.68 0.59  CALCIUM 10.1 8.6 9.3 9.3  MG  --   --  1.8 1.9  PHOS  --   --  1.4* 1.9*   Estimated Creatinine Clearance: 79.1 ml/min (by C-G formula based on Cr of 0.59).   LIVER  Recent Labs Lab 06/13/2013 1505 06/13/2013 1704 06/11/13 1055  AST 64* 29 21  ALT 46 30 25  ALKPHOS 112 91 87  BILITOT 0.7 0.8 0.8  PROT 7.5 5.2* 5.3*  ALBUMIN 3.5 2.6* 2.5*     INFECTIOUS  Recent Labs Lab 06/05/2013 1518 05/25/2013 1701 06/11/13 1055  LATICACIDVEN 2.50* 2.1 0.9     ENDOCRINE CBG (last 3)   Recent Labs  06/12/13 0426 06/12/13 0802 06/12/13 1218  GLUCAP 75 74 116*         IMAGING x48h  Dg Chest Port 1 View  06/12/2013   CLINICAL DATA:  Intubation.  EXAM: PORTABLE CHEST - 1 VIEW  COMPARISON:  DG CHEST 1V PORT dated  06/11/2013; DG CHEST 1V PORT dated 06/13/2013  FINDINGS: Endotracheal tube and left IJ line in stable position. Left IJ line again is seen projecting over the left upper chest along the aortic knob. Mediastinum hilar structures are unremarkable. Cardiomegaly unchanged. Perihilar and bibasilar atelectasis and/or infiltrate is noted. No pleural effusion or pneumothorax.  IMPRESSION: 1. Line and tube positions unchanged. 2. Stable cardiomegaly. 3. Stable bilateral perihilar and basilar atelectasis and/or infiltrates.   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    P:   extubate to comfort care.; dependent on goals of care outcome with Dr Hilma Favors  CARDIOVASCULAR(resolved) A: Septic shock, off pressors 3/26 P:      RENAL A:  At risk ATN but nl creatine P:       GASTROINTESTINAL A:  Esophageal compression, dysphagia per family from squam neck  Mass. S.p chronic peg Per RTX oncology, extermely poor prognosis.  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 Resumed keppra Started decadron 3/27  GLOBAL  Palliative care help greatly appreciated. FUll comfort, transition to [facility] when bed available . Defer further plan to Dr Hilma Favors  Kara Mead MD. [facility]. [place] [facility] Pager [phone] If no response call [phone]    06/13/2013 10:32 AM

## 2013-06-13 NOTE — Progress Notes (Signed)
Met with patient, his fiancee and her son during follow-up visit with Dr. Valere Dross.  Patient requested that planned chemotherapy tmt be transferred to [facility].   Per Dr. Valere Dross, I will facilitate the referral.  Continuing navigation as L1 patient (new patient).  Gayleen Orem, RN, BSN, [facility] Head [facility] Neck Oncology Navigator [phone]

## 2013-06-13 NOTE — Progress Notes (Signed)
Met with patient and his wife in [place].  Wife stated that arrangements are being made for transfer to [facility].  I provided support.    Gayleen Orem, RN, BSN, [facility] Neck Oncology Navigator [phone]

## 2013-06-13 NOTE — Progress Notes (Signed)
The page came early from ICU, room 1240, when the patient was located there.  When Chaplain arrived, the room had been changed and the patient moved and settled in 1312.  He was aslleep, likely due to the dilaudid he was receiving.  Present were his two nieces (one by two different brothers) one brother and the brother's wife.  The family seemed at peace with the choice of comfort care and the eventual demise of the patient, not shy of speaking about death.    One of the nieces was concerned about the arrival of her father, who is an alcoholic and somewhat combative, also in the ear stages of Alzheimer's (family furnished information).  We talked about not letting her anxiety rob her of grieving for her uncle.  Prayed for the patient after the wife (of one day; married in the hospital) arrived.  Chaplain also offered words of calm and encouragement, listening to the stories of family, also.  Assured nurse Chaplain was available if needed further all this weekend.  Loann Quill, Chaplain Pager: [phone]

## 2013-06-13 NOTE — Progress Notes (Signed)
Transitioned to full comfort. Transfer to [place]. Await bed at [facility]. Aggressive pain and dyspnea control. Provide psycho-social-spiritual support for their suffering.  Lane Hacker, DO Palliative Medicine

## 2013-06-13 NOTE — Consult Note (Addendum)
Palliative Medicine Team at [name]  Date: 06/13/2013   Patient Name: Wyatt [name]  DOB: August 25, 1930  MRN: [id number]  Age / Sex: 80 y.o., male   PCP: No primary provider on file. 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 Further imaging 04/2013 revealed metastatic disease into Wyatt C-Spine, facial nerve and paraspinal muscles. He had been having significant issues with pain in his neck and right shoulder. Wyatt plan was for him to have additional radiation and begin chemotherapy.  Wyatt patient requested to delay treatment until after his wedding to his longtime partner [name] which had been planned for March 21st- patient's condition deteriorated and they delayed Wyatt wedding but he presented on 3/23 for his first radiation treatment in his proposed series for his recurrence of SCC. He also had [name] PEG placed in Wyatt past few weeks [name] to worsening problems with dysphagia. Following his radiation treatment he presented to [facility] ED 3/25 in respiratory failure and with significant aspiration PNA and sepsis. He was intubated in [facility] ED emergently and after family decided that there may be [name] reversible condition.   Patient has been talking to his wife and step-son recently about his suffering and [name] desire to not continue treatment because of his QOL and poor prognosis- he even stated that he wanted to go to [name]. He is now intubated-alert and awake on Wyatt vent and PMT was consulted for goals of care.  Participants in Discussion: Patient (full capacity), fiance Wyatt [name], step son and his niece.  Goals/Summary of Discussion:  "Wyatt [name]" is asserting his wish for [name] "good death", he  communicates by writing on [name] legal pad- he says "ready to die" -"he doesn't want to die" "but doesn't want to suffer". He is afraid but wants it to be "over". He asks me to help him die. I explained Wyatt philosophy of palliative medicine and hospice care- and that there would be no need for something dramatic such as in Wyatt [name] cases-that I could promise him comfort, peace and to maintain his dignity- I also reassured him that we would do nothing to unnecessarily prolong his life-he said "you make sense"-he doesn't want anything else invasive- he wants to not feel his emotional and mental pain. He reports deep love for [name], "his soul mate. When I asked him how he wanted to spend whatever time her had left he wrote on [name] piece of paper "Will you marry me?" and showed it to Wyatt [name]-clearly love and grief in Wyatt room- this was his only request. I asked if I could call Wyatt Chaplain and arrange their union-either formally or informally. Patient expressed [name] desire to get married and then be extubated to full comfort. All in room in agreement and also surprised by this request-perhaps taken off guard that at Wyatt moment when they are deciding about extubation, Wyatt possibility of dying immanently that he wants to get married.   Arrangements were made and Wyatt patient was married to [name] in his ICU room with [name] small gathering of close family and [name] officiated as hospital chaplain.They already had Wyatt ring and also Wyatt official license.  He was successfully extubated and able to communicate- but  was having significant difficulty talking with his facial paralysis- he expressed clear understanding of Wyatt goals of care and progression towards comfort- he is afraid, he fear being re-intubated and wrote on [name] piece of paper "[place]"- noting [name] desire to go to [name].   I supported [name] transfer to [facility] place as soon as there was [name] bed available and he proved himself able to transport. High levels of anxiety,  disappointment, loss of control, sadness and also [name] sense of urgency in Wyatt room today -but sincere vows were exchanged and family trying very hard to find happiness in that moment.  I discussed in detail Wyatt various disease trajectories and possibilities for his ongoing care including full comfort, full comfort with limited medical interventions or continued aggressive treatment plan.   1. Code Status:  DNR  2. Scope of Treatment:  Comfort Measures Only  Has PEG, no continuous feeding-comfort feeding PO as tolerated  Aggressive Symptom Management  3. Assessment/Plan:  Primary Diagnoses  1. SCC Head and Neck 2. Aspiration 3. Dysphagia 4. Neck Pain, Back Pain  Prognosis : <2 weeks PPS 20%  Active Symptoms 1. Pain C-Spine, Right shoulder, facial pain-severe 2. Dyspnea 3. Dysphagia  Plan/Recs:  Currently on Fentanyl Infusion- was already on this in ICU- can transition to morphine if needed for comfort  Diazepam IV prn anxiety  Nebulizers  Supplemental O2 for comfort  Discontinue all meds not directly contributing to his comfort  Placed CSW order for [facility] transport whenever there is [name] bed available.  Time: 11AM-12:30, 230PM-3PM total time: 120 minutes Greater than 50%  of this time was spent counseling and coordinating care related to Wyatt above assessment and plan.  Signed by: Acquanetta Chain, DO  06/13/2013, 12:28 AM  Please contact Palliative Medicine Team phone at [phone] for questions and concerns.

## 2013-06-14 DIAGNOSIS — Z515 Encounter for palliative care: Secondary | ICD-10-CM

## 2013-06-14 MED ORDER — IPRATROPIUM-ALBUTEROL 0.5-2.5 (3) MG/3ML IN SOLN: 3.0000 mL | RESPIRATORY_TRACT | Status: DC | PRN

## 2013-06-15 ENCOUNTER — Ambulatory Visit: Payer: Medicare Other

## 2013-06-15 ENCOUNTER — Ambulatory Visit: Payer: Self-pay | Admitting: Hematology and Oncology

## 2013-06-15 ENCOUNTER — Ambulatory Visit: Payer: Self-pay

## 2013-06-16 ENCOUNTER — Ambulatory Visit: Payer: Self-pay

## 2013-06-16 ENCOUNTER — Ambulatory Visit: Payer: Self-pay | Admitting: Hematology and Oncology

## 2013-06-16 ENCOUNTER — Ambulatory Visit: Payer: Medicare Other

## 2013-06-16 LAB — CULTURE, BLOOD (ROUTINE X 2)
Culture: NO GROWTH
Culture: NO GROWTH

## 2013-06-17 ENCOUNTER — Ambulatory Visit: Payer: Medicare Other

## 2013-06-17 NOTE — Progress Notes (Signed)
Per Dr. Hilma Favors, Pt very likely GIP, if he survives.  RNCM notified.  No CSW needs.  CSW to sign off.  Bernita Raisin, [place] [facility] [phone]

## 2013-06-17 NOTE — Progress Notes (Signed)
Called to room to verify, pt with no respirations, heart sounds or pulse.

## 2013-06-17 NOTE — Progress Notes (Signed)
Rt did not give neb TX this am. Pt is sleeping looks comfortable. Pt is palliative care/full comfort. When pt awakes and neb is needed rt will give prn.

## 2013-06-17 NOTE — Progress Notes (Signed)
Rt made pt Duoneb Q2 PRN only. Pt is palliative care/full comfort care. Pt or family can call for when neb is needed per pt comfort.  Per Dr. Hilma Favors note's anticipating hospital death less than 24 hours.

## 2013-06-17 NOTE — Progress Notes (Signed)
Palliative Care Team at [facility] Progress Note   SUBJECTIVE: Unresponsive. Comfortable.  Interval Events: Extubated 3/27 to full comfort.  OBJECTIVE: Vital Signs: BP 162/74  Pulse 81  Temp(Src) 98.2 F (36.8 C) (Axillary)  Resp 18  Ht 5\' 10"  (1.778 m)  Wt 89.676 kg (197 lb 11.2 oz)  BMI 28.37 kg/m2  SpO2 90%   Intake and Output: 03/28 0701 - 03/29 0700 In: 96 [I.V.:67] Out: 555 [Urine:555]  Physical Exam: General: Vital signs reviewed and noted. No Distress.  Head: Right sided paralylsis,   Lungs:  Congestion, Rhonchi  Heart: Tachy. S1 and S2 normal without gallop,  or rubs. (+) murmur  Abdomen:  BS normoactive. Soft, Nondistended, non-tender.  No masses or organomegaly.  Extremities: + edema.    No Known Allergies  Medications: Scheduled Meds:  . atropine  2 drop Sublingual QID  . diazepam  5 mg Intravenous 6 times per day  . glycopyrrolate  0.1 mg Intravenous TID  . ipratropium-albuterol  3 mL Nebulization Q6H  . scopolamine  1 patch Transdermal Q72H  . senna-docusate  2 tablet Per Tube QHS    Continuous Infusions: . HYDROmorphone 1 mg/hr (06/13/13 2059)    PRN Meds: sodium chloride, acetaminophen (TYLENOL) oral liquid 160 mg/5 mL, artificial tears, diazepam, HYDROmorphone, ipratropium, ondansetron (ZOFRAN) IV  Labs: CBC    Component Value Date/Time   WBC 9.6 06/12/2013 0500   RBC 3.90* 06/12/2013 0500   HGB 12.2* 06/12/2013 0500   HCT 35.9* 06/12/2013 0500   PLT 194 06/12/2013 0500   MCV 92.1 06/12/2013 0500   MCH 31.3 06/12/2013 0500   MCHC 34.0 06/12/2013 0500   RDW 13.7 06/12/2013 0500   LYMPHSABS 0.8 06/22/2013 1505   MONOABS 1.4* 06-22-2013 1505   EOSABS 0.0 06-22-2013 1505   BASOSABS 0.0 06/22/2013 1505    CMET     Component Value Date/Time   NA 138 06/12/2013 0500   K 3.4* 06/12/2013 0500   CL 104 06/12/2013 0500   CO2 23 06/12/2013 0500   GLUCOSE 88 06/12/2013 0500   BUN 14 06/12/2013 0500   CREATININE 0.59 06/12/2013 0500   CALCIUM 9.3  06/12/2013 0500   PROT 5.3* 06/11/2013 1055   ALBUMIN 2.5* 06/11/2013 1055   AST 21 06/11/2013 1055   ALT 25 06/11/2013 1055   ALKPHOS 87 06/11/2013 1055   BILITOT 0.8 06/11/2013 1055   GFRNONAA >90 06/12/2013 0500   GFRAA >90 06/12/2013 0500    ASSESSMENT/ PLAN: 80 yo man actively dying after asserting his personal wish to be made full comfort care and be allowed to have a swift and natural death. He exchanged wedding vows with his long time partner Kandis Nab prior to extubation. He reported being at peace and a strong desire to not suffer.   Medical interventions de-escalated  Started on dilaudid infusion now at 2mg /hr  Scheduled diazepam  2 Scop patches, Robinul and atropine for secretions as well as nebulized ipratropium to control congestion discomfort.  Oxygen escalated last night- I turned O2 down to 1L for flow- discussed with family removing O2-not contributing to comfort and may prolong dying process.  At this point I do not think he is stable for transport to a Hospice Facility-anticipate a hospital death <24 hours.  Lane Hacker, DO Palliative Medicine    Time In: 1000 Time Out: 1035 Total Time Spent with Patient: 35 min Total Overall Time: 35 min   Greater than 50%  of this time was spent counseling and coordinating  care related to the above assessment and plan.   Acquanetta Chain, DO  06/13/2013, 11:21 AM  Please contact Palliative Medicine Team phone at [phone] for questions and concerns.

## 2013-06-17 NOTE — Progress Notes (Signed)
RN called into room by family. Patient with no respirations, heart sounds, or pulse.  MD notified and death pronounced at 80 with Delora Fuel, RN.

## 2013-06-17 NOTE — Progress Notes (Signed)
Wasted 29 ml of dilaudid drip. Witnessed by Delora Fuel, RN.

## 2013-06-17 NOTE — Progress Notes (Signed)
Per family and previous discussion with Dr Hilma Favors, pt's nasal cannula taken off and patient left on room air.

## 2013-06-17 NOTE — Progress Notes (Signed)
PULMONARY / CRITICAL CARE MEDICINE   Name: Wyatt [name] MRN: [id number] DOB: Apr 01, 1930    ADMISSION DATE:  05/22/2013 CONSULTATION DATE:  06/06/2013  REFERRING MD :  Mingo Amber, MD PRIMARY SERVICE: PCCM  CHIEF COMPLAINT:  Resp failure  BRIEF PATIENT DESCRIPTION:    80 yr old WM with recurrent  neck cancer, PEG, reasonably functional, presented with resp failure. Has had radiation and by radiation oncology note poor prognosis .  Recent PEG placed 3/20 and reported as high asp risk. IN ED , confused, hypoxic. ETT placed. Drop in BP, neo started, volume given. Called to admit. Also noted was esophageal extrinsic compression .    SIGNIFICANT EVENTS / STUDIES:  3/25 - ETT, Pressors 3/27 one way extubation after marriage service  LINES / TUBES: Left IJ 3/25>>> ETT 3/25>>> 3/27  CULTURES: 3/25 BC>>>neg 3/25 urine>>>ng  ANTIBIOTICS: 3/25 Vanc>>> 3/25 Iminem>>>   SUBJECTIVE:  Appears comfortable.  Family at bedside.  VITAL SIGNS: SpO2:  [85 %-90 %] 90 % (03/29 0158) HEMODYNAMICS:   VENTILATOR SETTINGS:   INTAKE / OUTPUT: Intake/Output     03/28 0701 - 03/29 0700 03/29 0701 - 03/30 0700   I.V. (mL/kg) 67 (0.7) 64.7 (0.7)   IV Piggyback     Total Intake(mL/kg) 67 (0.7) 64.7 (0.7)   Urine (mL/kg/hr) 555 (0.3)    Total Output 555     Net -488 +64.7          PHYSICAL EXAMINATION: General:  Unresponsive on Iv narcotics.  No increased wob.     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    P:   extubated to comfort care.; appears comfortable on drip.

## 2013-06-17 DEATH — deceased

## 2013-06-18 ENCOUNTER — Ambulatory Visit: Payer: Medicare Other

## 2013-06-19 ENCOUNTER — Ambulatory Visit: Payer: Medicare Other

## 2013-06-20 ENCOUNTER — Encounter: Payer: Self-pay | Admitting: Radiation Oncology

## 2013-06-20 NOTE — Progress Notes (Signed)
[name] End of Treatment Note  Name:Wyatt [name]  Date: 06/20/2013 MAY:[id number] DOB:April 26, 1930   Status:outpatient    CC:   Janalee Dane [phone]), Dr. Delight Hoh [phone]), Dr Noreene Filbert, Dr. Jorja Loa  REFERRING PHYSICIAN:   Dr. 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 rapidly deteriorated during his first week radiation therapy. He did not receive any chemotherapy. He had progression of cranial nerve involvement to include cranial nerve X which led to aspiration pneumonia and sepsis. His treatment was discontinued after 2 fractions, and he was offered comfort care. He passed away earlier this week.

## 2013-06-22 ENCOUNTER — Ambulatory Visit: Payer: Medicare Other

## 2013-06-23 ENCOUNTER — Ambulatory Visit: Payer: Medicare Other

## 2013-06-24 ENCOUNTER — Ambulatory Visit: Payer: Medicare Other

## 2013-06-25 ENCOUNTER — Ambulatory Visit: Payer: Medicare Other

## 2013-06-26 ENCOUNTER — Ambulatory Visit: Payer: Medicare Other

## 2013-06-29 ENCOUNTER — Ambulatory Visit: Payer: Medicare Other

## 2013-06-30 ENCOUNTER — Ambulatory Visit: Payer: Medicare Other

## 2013-07-01 ENCOUNTER — Ambulatory Visit: Payer: Medicare Other

## 2013-07-02 ENCOUNTER — Ambulatory Visit: Payer: Medicare Other

## 2013-07-03 ENCOUNTER — Ambulatory Visit: Payer: Medicare Other

## 2013-07-06 ENCOUNTER — Ambulatory Visit: Payer: Medicare Other

## 2013-07-07 ENCOUNTER — Ambulatory Visit: Payer: Medicare Other

## 2013-07-08 ENCOUNTER — Ambulatory Visit: Payer: Medicare Other

## 2013-07-09 ENCOUNTER — Ambulatory Visit: Payer: Medicare Other

## 2013-07-10 ENCOUNTER — Ambulatory Visit: Payer: Medicare Other

## 2013-07-13 ENCOUNTER — Ambulatory Visit: Payer: Medicare Other

## 2013-07-14 ENCOUNTER — Ambulatory Visit: Payer: Medicare Other

## 2013-07-15 ENCOUNTER — Ambulatory Visit: Payer: Medicare Other

## 2013-07-16 ENCOUNTER — Ambulatory Visit: Payer: Medicare Other

## 2013-07-17 ENCOUNTER — Ambulatory Visit: Payer: Medicare Other

## 2013-07-20 ENCOUNTER — Ambulatory Visit: Payer: Medicare Other

## 2013-07-21 ENCOUNTER — Ambulatory Visit: Payer: Medicare Other

## 2013-07-22 ENCOUNTER — Ambulatory Visit: Payer: Medicare Other

## 2013-07-23 ENCOUNTER — Ambulatory Visit: Payer: Medicare Other

## 2013-07-24 ENCOUNTER — Ambulatory Visit: Admit: 2013-07-24 | Payer: Medicare Other

## 2013-07-27 ENCOUNTER — Ambulatory Visit: Admit: 2013-07-27 | Payer: Medicare Other

## 2013-08-12 NOTE — Discharge Summary (Signed)
DISCHARGE SUMMARY    Date of admit: 06/09/2013  2:24 PM Date of discharge: 05/29/2013  4:07 PM Length of Stay: 4 days  PCP is No primary provider on file.   PROBLEM LIST Active Problems:   Pneumonia   Acute respiratory failure with hypoxia   Protein-calorie malnutrition, severe Head and NEck Cancer   SUMMARY Wy[name] was 80 y.o. patient with    has a past medical history of Bell's palsy; Hypertension; Chronic kidney disease; Squamous cell carcinoma (04/23/13); Arthritis; Paralytic ptosis; Cervicalgia; and radiation therapy (07/28/12- 09/03/12).   has past surgical history that includes eyelid surgery (Right); resection right cheek mass (Right, 07/17/2012); and Back surgery (1999).   Admitted on 05/22/2013 with  80 yr old WM with recurrent neck cancer, PEG, reasonably functional, presented with resp failure. Has had radiation and by radiation oncology note poor prognosis . Recent PEG placed 3/20 and reported as high asp risk. IN ED , confused, hypoxic. ETT placed. Drop in BP, neo started, volume given. Called to admit. Also noted was esophageal extrinsic compression .   SIGNIFICANT EVENTS / STUDIES:  3/25 - ETT, Pressors  3/27 one way extubation after marriage service 3/28: Extubated . Appears in mild discomfort on fent gtt    Then on 05/22/2013: he was unresponsive and comfortable and under care of palliative care services expired on 05/25/2013       SIGNED Dr. Brand Males, M.D., F.C.C.P Pulmonary and Critical Care Medicine Staff Physician [place] [facility] Pager: [phone], If no answer or between  15:00h - 7:00h: call [phone]  08/12/2013 5:22 PM

## 2013-11-17 NOTE — Progress Notes (Signed)
Witnessed waste with Nancy Marus RN.

## 2014-07-09 NOTE — Consult Note (Signed)
Reason for Visit: This 81 year old Male patient presents to the clinic for initial evaluation of  squamous cell carcinoma right cheek .   Referred by Dr. Nadeen Landau.  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 CT scan was ordered showing an 11 mm subcutaneous nodule anterior and inferior to the inferior portion of the right parotid gland. No pathologic size neck nodes were seen. Patient was taken to the OR by Dr. Carlis Abbott and underwent a wide local excision for well-differentiated keratinizing squamous cell carcinoma measuring 1.5 cm. Tumor was well-differentiated in maximum thickness of 10 mm. Peripheral margin was involved deep margin was close at 2 mm. Lymphovascular invasion was not identified perineural invasion was present.One level II lymph node was examined and no evidence of metastatic disease. Patient has done well postoperatively. His case was presented at our weekly tumor conference.based on his close margins he is a candidate for adjuvant radiation therapy to the area of initial tumor involvement. He is doing well. He is having no complaints. His having no dysphagia.  Past Hx:    Hypertension:    Mass Right Neck:    Back Surgery 1999:   Past, Family and Social History:  Past Medical History positive   Cardiovascular hypertension   Past Surgical History back surgery   Family History noncontributory   Social History noncontributory   Additional Past Medical and Surgical History accompanied by male friend today   Allergies:   No Known Allergies:   Home Meds:   Home Medications: Medication Instructions Status  potassium phosphate-sodium phosphate 2 tab(s) orally once a day (in the morning) Active  Metoprolol Succinate ER 25 mg oral tablet, extended release 1 tab(s) orally once a day (in the morning) Active  levETIRAcetam 500 mg oral tablet 1 tab(s) orally 2 times a day Active  losartan 100 mg oral tablet 1 tab(s) orally once a day (in the morning) Active  VESIcare 5 mg oral tablet 1 tab(s) orally once a day (at bedtime) Active  Jalyn 0.5 mg-0.4 mg oral capsule 1 cap(s) orally once a day (in the morning) Active  Vytorin 10 mg-20 mg oral tablet 1 tab(s) orally once a day (at bedtime) Active  Namenda 10 mg oral tablet 1 tab(s) orally 2 times a day Active  AcipHex 20 mg oral delayed release tablet 1 tab(s) orally once a day, As Needed Active  fluorouracil topical 5% topical solution Apply topically to affected area 2 times a day to irritated areas on body as needed Active  aspirin 81 mg oral tablet 1 tab(s) orally once a day (at bedtime) Active  Metamucil 500 mg oral capsule 2 cap(s) orally 2 times a day Active  Keflex 750 mg oral capsule 1 cap(s) orally every 12 hours Active   Review of Systems:  General negative   Performance Status (ECOG) 0   Skin see HPI   Breast negative   Ophthalmologic negative   ENMT negative   Respiratory and Thorax negative   Cardiovascular negative   Gastrointestinal negative   Genitourinary negative   Musculoskeletal negative   Neurological negative  Psychiatric negative   Hematology/Lymphatics negative   Endocrine negative   Allergic/Immunologic negative   Nursing Notes:  Nursing Vital Signs and Chemo Nursing Nursing Notes: *CC Vital Signs Flowsheet:   07-May-14 09:25  Temp Temperature 98  Pulse Pulse 105  Respirations Respirations 20  SBP SBP 149  DBP DBP 83  Pain Scale (0-10)  2 Right Shoulder  Current Weight (kg) (kg) 102.4  Height (cm) centimeters 170.3  BSA (m2) 2.1   Physical  Exam:  General/Skin/HEENT:  General normal   Eyes normal   ENMT normal   Head and Neck normal   Additional PE well-developed slightly obese male in NAD. She status post wide local excision right cheek with excellent healing excellent cosmetic result. Oral cavity is clear teeth are in a good state of repair. No oral mucosal lesions are identified. Indirect mirror examination shows upper airway clear vallecula and base of tongue within normal limits. Neck is clear without evidence of some digastric cervical or supraclavicular adenopathy. Lungs are clear to A&P cardiac examination shows regular rate and rhythm.   Breasts/Resp/CV/GI/GU:  Respiratory and Thorax normal   Cardiovascular normal   Gastrointestinal normal   Genitourinary normal   MS/Neuro/Psych/Lymph:  Musculoskeletal normal   Neurological normal   Lymphatics normal   Other Results:  Radiology Results: LabUnknown:    30-Apr-14 08:48, CT Head WWO Contrast  PACS Image     30-Apr-14 08:49, CT Neck With Contrast  PACS Image   CT:    30-Apr-14 08:48, CT Head WWO Contrast  CT Head WWO Contrast   REASON FOR EXAM:    LABs 1st skull base to clavicles  include RT   perifacial lymphnodes  mass RT ...  COMMENTS:       PROCEDURE: CT  - CT HEAD W/WO  - Jul 16 2012  8:48AM     RESULT: Axial CT scanning was performed through the brain both prior to   and following administration of 75 cc of Isovue-370.    Along the inner table of the posterior right frontal bone there is a   hyperdense structure on the noncontrast images measuring approximately   1.5 cm AP. This enhances diffusely on the postcontrast images and is most   compatible with a meningioma. Elsewhere the brain parenchyma is normal in   density. The ventricles are normal in size and position. There is no   intracranial hemorrhage nor intracranial mass effect. There are no     findingsto suggest intracranial edema. On the postcontrast images no   other areas  of abnormal enhancement are demonstrated.     There are focal nonenhancing lucencies in the left cerebellar hemisphere   inferiorly which may reflect the sequelae of previous lacunar   infarctions. The visualized portions of the intracranial vasculature is   normal.    At bone window settings there is no evidence of an acute skull fracture.   There is no lytic nor blastic bony lesion. There is no endosteal   scalloping adjacent to the hyperdense enhancing posterior right frontal   convexity mass.    IMPRESSION:   1. There is an enhancing mass along the posterior right frontal cortex.     On noncontrast imaging it is hyperdense. This is most compatible with a   meningioma.  2. There are are no masses or mass effect demonstrated elsewhere.  3. There is no evidence of an acute ischemic infarction or intracranial   hemorrhage.  4. There are likely old lacunar infarctions in  the inferior aspect of the   left cerebellar hemisphere.     Dictation Site: 1        Verified By: DAVID A. Martinique, M.D., MD    30-Apr-14 08:49, CT Neck With Contrast  CT Neck With Contrast   REASON FOR EXAM:    LABs 1st skull base to clavicles  include RT   perifacial lymphnodes  mass RT ...  COMMENTS:       PROCEDURE: CT  - CT NECK WITH CONTRAST  - Jul 16 2012  8:49AM     RESULT: Axial CT scanning was performed through the neck with   reconstructions at 3 mm intervals and slice thicknesses. Review of   multiplanar reconstructed images was performed separately on the VIA   monitor. The patient received 75 cc of Isovue 300.    There is metallic beam hardening artifact from the patient's dental work   which limits evaluation of the soft tissues of the malar region. Inferior   to the artifact the subcutaneous tissues exhibit normal density. In the   right  inferior auricular-subparotid region there is a subcutaneous     nodule demonstrated on images 49 through 51. This measures approximately   7 x 11 mm.  It demonstrates mild enhancement. The parotid glands are   normal in density and contour. The submandibular glands also appear   normal. No bulky anterior or posterior cervical lymph nodes are   demonstrated. The carotid and jugular vessels are normal in appearance.    There is marked enlargement of the left thyroid lobe as compared to the   right. It contains multiple dominant nodules. The maximal AP dimension of   the leftthyroid lobe is 5.4 cm and transversely the maximal dimension is   3.2 cm. In superior to inferior dimension it measures 6.6 cm. The    maximal dimension of the hypodense left thyroid nodule is 4.6 cm. Smaller   amounts of nodularity are demonstratedin the right thyroid lobe.    The laryngeal structures are normal in appearance. The observed portions   of the paranasal sinuses and nasal passages also appear normal.  At bone window settings there are degenerative changes of the mid and   lower cervical spine. Very mild wedging of the anterior aspect of C6 is   present. The prevertebral soft tissue spaces are normal in appearance.   There is no evidence of a perched facet. The odontoid is intact. The   pulmonary apices exhibit no acute abnormalities.    IMPRESSION:   1. There is an 100mm subcutaneous nodule anterior and inferior to the   inferior portion of the right parotid gland. This may reflect a sebaceous   cyst or very superficial lymph node. Other etiologies cannot dogmatically   excluded.   2. No pathologic sized anterior or posterior cervical lymph nodes or   supraclavicular nodes are demonstrated. The parotid and submandibular   glands are normal in density.  3. There is marked enlargement of the left thyroid lobe which extends or     short distance into the thoracic cavity. Both left and right thyroid   lobes demonstrate dominant hypodense nodules.  4. There are degenerative changes of the cervical spine.     Dictation Site: 1        Verified By:  DAVID A. Martinique, M.D., MD   Relevent Results:   Relevant Scans and Labs head and neck CT scans reviewed. Incidental finding of aright meningioma   Assessment and Plan:  Impression:   laryngeal squamous cell carcinoma right cheek status post wide local excision with close margins in 81 year old male. Plan:   at this time I recommend electron beam therapy to the area of local regional involvement. Would plan on delivering 5000 cGy over 5 weeks. Patient is concerned about travel back and forth since he is living [place] this time of year and I have told him I can do an abbreviated treatment schedule of 4 days of treatment per week as long as we get 25 treatments in. Risks and benefits of treatment including skin reaction, fatigue and some possible oral mucositis from the treatments were all explained in detail to the patient. He is anxious to get started I have set him up for CT simulation tomorrow.  I would like to take this opportunity to thank you for allowing me to continue to participate in this patient's care.  CC Referral:  cc: Dr. Nadeen Landau   Electronic Signatures: Baruch Gouty, Roda Shutters (MD)  (Signed 07-May-14 11:48)  Authored: HPI, Diagnosis, Past Hx, PFSH, Allergies, Home Meds, ROS, Nursing Notes, Physical Exam, Other Results, Relevent Results, Encounter Assessment and Plan, CC Referring Physician   Last Updated: 07-May-14 11:48 by Armstead Peaks (MD)

## 2014-07-09 NOTE — Op Note (Signed)
PATIENT NAME:  Wyatt [name], Wyatt [name] MR#:  [id number] DATE OF BIRTH:  1930-07-21  DATE OF PROCEDURE:  11/20/2012  SURGEON:  Janalee Dane, MD.   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  PROCEDURES: 1.  Right upper eyelid platinum weight placement (1.2 grams).  2.  Myocutaneous flap for facial paralysis.   DESCRIPTION OF PROCEDURES:  The patient was placed in the supine position on the operating room table after heavy IV sedation had been instilled. The patient was turned 90 degrees counterclockwise from anesthesia and placed in a slight head turn left position. The right upper eyelid was marked, locally anesthetized, prepped and draped in the usual fashion. Topical proparacaine was used for placement of a corneal shield. The local infiltrate was 0.5% lidocaine and 0.25% bupivacaine mixed 1:150,000 with epinephrine. An incision just above the supratarsal fold was placed and a suborbicularis oculi muscle plane was elevated over the tarsal plate. Once adequate formation of the recipient site for the platinum weight had been created, the platinum weight fit very snugly and was sutured into position with the 2 superior holes using 6-0 fast absorbing gut. The orbicularis oculi muscle was approximated with 6-0 fast absorbing gut single suture in the center and the skin incision was closed with 7-0 nylon sutures. Attention was directed to the right hemiface where the incisions were marked into the temporal hairline extending down around the folds of the preauricular skin and infra-auricularly, the incisions were locally anesthetized, prepped and draped in the usual fashion. A #15 blade was used to raise a deep subdermal flap to a line marking the origin of the zygomaticus major muscle and the angle of the mandible. A right-sided SMAS flap was elevated to direct the ptotic midfacial SMAS in a  superolateral direction. A pursestring 3-0 PDS suture was then used to suspend the SMAS at the zygomatic arch on the right.  Conservative trimming of the skin was carried out, followed by resuspension of the skin in a superolateral orientation using interrupted 4-0 Vicryl sutures. The skin was then closed with a combination of interrupted and running 6-0 nylon sutures. Iced gauze was placed on the eye after the corneal shield had been removed. The patient was then returned to anesthesia, allowed to emerge from anesthesia in the operating room, and taken to the recovery room in stable condition. There were no complications. Estimated blood loss 15 mL.    ____________________________ J. Nadeen Landau, MD jmc:ce D: 11/20/2012 16:00:52 ET T: 11/20/2012 16:43:32 ET JOB#: [id number]  cc: Janalee Dane, MD, <Dictator> Nicholos Johns MD ELECTRONICALLY SIGNED 12/12/2012 9:58

## 2014-07-09 NOTE — Op Note (Signed)
PATIENT NAME:  Wyatt [name], Wyatt [name] MR#:  [id number] DATE OF BIRTH:  Mar 06, 1931  DATE OF PROCEDURE:  07/17/2012  SURGEON:  Janalee Dane, MD  PREOPERATIVE DIAGNOSIS: Right cheek/neck mass (suspicious for cancer).   POSTOPERATIVE DIAGNOSIS:  Right cheek/neck mass (suspicious for cancer). (Frozen section the pathology confirmed squamous cell carcinoma).   PROCEDURES: 1.  Radical resection right cheek/neck cancer. 2.  Limited neck dissection (level 2 and 3A).    DESCRIPTION OF PROCEDURE: The patient was placed in supine position on the operating table. After general LMA anesthesia had been induced, the patient was turned 90 degrees counterclockwise from anesthesia and placed in a head turned left position. The previous scar was outlined with a surgical marking pen and care was taken to include the entire scar. After the area had been marked under loop magnification and operative lighting, the area was locally anesthetized, prepped and draped in the usual fashion. A #15 blade was used to make an incision to include the entire previous scar and care was taken to include all of the soft tissue between the scar and the palpable mass. Meticulous dissection was carried down around the mass to include a generous soft tissue cuff.  The CT scan was committed to memory and also up in the operating room on the LED screen. The mass, including the platysma around the mass, was resected. The marginal mandibular nerve was preserved and nerve monitoring and nerve stimulation were used in conjunction with meticulous dissection to preserve the marginal mandibular nerve, but to include the fibrofatty tissue around the platysma and the platysma in the area of the lesion in the resection as well as the subcutaneous tissue and skin. The mass was oriented for pathology and I personally delivered it to the pathologist indicated the area that needed to be closely examined. Subsequent to that,  the frozen section was positive for  squamous cell carcinoma. During pathologic analysis the wound was palpated and there were firm lymph nodes deep to the sternocleidomastoid muscle in levels 2 and 3A. Therefore, based on a diagnosis of cancer, additional dissection was carried medial to the sternocleidomastoid muscle taking care to preserve the hypoglossal nerve and once again, the marginal mandibular nerve. This tissue was reflected superiorly and the lymph nodes in levels 2 and 3A in a coordinated fashion were dissected and sent for frozen section analysis. This was subsequently negative. Meticulous hemostasis was achieved in the wound. The wound was closed in layers with 4-0 Vicryl and closed over a 7 mm Jackson-Pratt drain. The wound was then dressed with bacitracin. The patient was returned to anesthesia, allowed to emerge from anesthesia in the operating room, and taken to the recovery room in stable condition.   COMPLICATIONS:  There were no complications.   ESTIMATED BLOOD LOSS: 20 mL    ____________________________ J. Nadeen Landau, MD jmc:cc D: 07/17/2012 22:31:00 ET T: 07/17/2012 23:45:53 ET JOB#: [id number]  cc: Janalee Dane, MD, <Dictator> Nicholos Johns MD ELECTRONICALLY SIGNED 08/05/2012 6:16

## 2014-07-10 NOTE — Consult Note (Signed)
Chief Complaint:  Subjective/Chief Complaint Patient s/p PEG ube without any compliants today. No abdominal pain reported.   VITAL SIGNS/ANCILLARY NOTES: **Vital Signs.:   21-Mar-15 06:15  Vital Signs Type Post Medication  Pulse Pulse 78  Systolic BP Systolic BP 932  Diastolic BP (mmHg) Diastolic BP (mmHg) 99  Mean BP 125   Brief Assessment:  GEN well developed, well nourished, no acute distress   Gastrointestinal Normal   Gastrointestinal details normal PEG site looks good.   Additional Physical Exam Alert and orientated times 3   Lab Results: Routine Chem:  19-Mar-15 04:59   Result Comment platelet - SMEAR SCANNED  Result(s) reported on 04 Jun 2013 at 06:25AM.  Glucose, Serum 80  BUN 14  Creatinine (comp) 0.82  Sodium, Serum 142  Potassium, Serum 3.5  Chloride, Serum  108  CO2, Serum 27  Calcium (Total), Serum 9.2  Anion Gap 7  Osmolality (calc) 283  eGFR (African American) >60  eGFR (Non-African American) >60 (eGFR values <47m/min/1.73 m2 may be an indication of chronic kidney disease (CKD). Calculated eGFR is useful in patients with stable renal function. The eGFR calculation will not be reliable in acutely ill patients when serum creatinine is changing rapidly. It is not useful in  patients on dialysis. The eGFR calculation may not be applicable to patients at the low and high extremes of body sizes, pregnant women, and vegetarians.)  Routine Hem:  19-Mar-15 04:59   WBC (CBC) 10.1  RBC (CBC) 4.48  Hemoglobin (CBC) 14.3  Hematocrit (CBC) 41.6  Platelet Count (CBC) 201  MCV 93  MCH 31.9  MCHC 34.3  RDW 14.1  Neutrophil % 78.0  Lymphocyte % 10.9  Monocyte % 8.6  Eosinophil % 1.2  Basophil % 1.3  Neutrophil #  7.9  Lymphocyte # 1.1  Monocyte # 0.9  Eosinophil # 0.1  Basophil # 0.1   Assessment/Plan:  Assessment/Plan:  Assessment Dysphagia s/p PEG   Plan PEG site looks good.   Electronic Signatures: WLucilla Lame(MD)  (Signed 21-Mar-15  10:09)  Authored: Chief Complaint, VITAL SIGNS/ANCILLARY NOTES, Brief Assessment, Lab Results, Assessment/Plan   Last Updated: 21-Mar-15 10:09 by WLucilla Lame(MD)

## 2014-07-10 NOTE — Consult Note (Signed)
PATIENT NAME:  Wyatt, [name] MR#:  [id number] DATE OF BIRTH:  [birth year]  DATE OF CONSULTATION:  06/03/2013  REFERRING PHYSICIAN:  [place] Sink, MD CONSULTING PHYSICIAN:  Wyatt Sails, MD  REASON FOR CONSULTATION: Dysphagia.   HISTORY OF PRESENT ILLNESS: Mr. Wyatt [name] is an 81 year old Caucasian male who came to the hospital with poor appetite and problems with swallowing. He states that he came to the Emergency Room because of a problem of dry mouth, about 20-pound weight loss over the past several weeks, also somewhat difficulty breathing. He stated that he has been eating very little over the past couple of weeks. He had some ginger ale this morning that went to the back of his throat and then back up through his nose. He has a complex recent medical history in that he had had several skin lesions removed by his local practitioner in [place] and 1 of these were found to be cancer. Subsequent evaluation by Dr. Nadeen [name] done in late April 2014 indicated a mass. On 07/17/12, he underwent a procedure which obtained tissue showing a  well differentiated keratitic squamous cell cancer. He subsequently underwent radiation treatment as he did not want to have a full resection at the time. This was completed successfully, however, he developed a right facial paralysis during the treatment. He underwent a repeat biopsy and resection about 04/23/2013 and subsequently has had changes as described above. He had been sent to see Wyatt [name] yesterday and plans were made because of the apparent symptoms of possible dysphagia for an EGD which was scheduled for tomorrow. However, his symptoms worsened and he came to the Emergency Room. He states that he has been having some amount of difficulty swallowing for about 3 weeks, currently with a dry mouth. His last solids were eaten perhaps at least a week ago. He states he has "no desire to eat." His dysphagia symptoms seem to be more so cervical. He  actually saw Wyatt [name] yesterday for followup and there was some arrangement for him to begin a second course of radiation treatment with chemotherapy sensitization protocol. He states he has been even intolerant to drinking larger amounts of water for about 2 days. He is, however, handling his own secretions. There is no nausea. There is no emesis. There is no odynophagia. He denies any history of heartburn. He denies any history of dysphagia prior to his radiation treatment and, indeed, even up to the past several weeks. He has no history of peptic ulcer disease. He has never undergone an esophagogastroduodenoscopy in the past. He has undergone a speech-language pathology evaluation, that procedure being done on 05/29/2013. The results of this showed a decreased laryngeal excursion, reduced pharyngeal pressure, decreased UES relaxation and opening all contributing to a poor and inefficient bolus motility through the pharynx into the esophagus. He shows a moderate to severe bullous residue remaining in the pharynx.   PAST MEDICAL HISTORY: 1.  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  2.  Possible Bell's palsy.  3.  Osteoarthritis.  4.  Hypertension.  5.  New-onset dysphagia.  6.  Possible early dementia.  7.  Nephrolithiasis.  8.  Depression.   PAST SURGICAL HISTORY: Back surgery, remote.   OUTPATIENT MEDICATIONS:  Include rabeprazole 20 mg twice a day, Aricept 10 mg once a day, Cozaar 100 mg once a day, gabapentin 300 mg 2 capsules 3 times a day, Jalyn 0.5 to 0.4 mg oral capsule once a day,  K-Phos, Keppra 500 mg twice a day, Namenda 10 mg orally twice a day Norco 5 mg/325 mg 1 to 2 tablets 3 times a day p.r.n., Systane ophthalmic solution, Toprol-XL 25 mg once a day, venlafaxine 75 mg twice a day, Viagra 100 mg once a day, Vytorin 10/20 mg once a day.   ALLERGIES: He has no known drug allergies.   REVIEW OF SYSTEMS: Ten systems reviewed per admission history and  physical, agree with same.   PHYSICAL EXAMINATION: VITAL SIGNS: Temperature is 98, pulse 63, respirations 20, blood pressure 181/88, pulse ox 99%.  GENERAL: He is an 81 year old Caucasian male, no acute distress.  HEENT: Shows a marked left facial droop as well as some epiphora and poor lid closure. This is on the right side of the face. He shows evidence of his surgery from the anterior of the preauricular area down to the base of the neck.  HEART: Regular rate and rhythm without rub or gallop.  LUNGS: Bilaterally clear.  ABDOMEN: Soft, nontender, nondistended. Bowel sounds positive, normoactive.  RECTAL: Anorectal exam deferred.  EXTREMITIES: No clubbing, cyanosis or edema.  NEUROLOGICAL: Cranial nerves II through XII grossly intact. Muscle strength bilaterally equal and symmetric, 5 out of 5. DTRs bilaterally equal and symmetric.   LABORATORY, DIAGNOSTIC AND RADIOLOGICAL DATA: He had a glucose of 99, BUN 18, creatinine 0.94, sodium 139, potassium 3.4, chloride 104, bicarb 32. Hepatic profile showing an alkaline phosphatase at 129, otherwise normal. His hemogram was normal with the exception of a slight shift in neutrophils at 6.8 that would be 79%; white cell count, however, was normal at 8.6, hemoglobin and hematocrit 15.1/44.6, platelet count of 257. He had a CT scan of the neck with contrast, this showing unusual superficial soft tissue tumor inseparable from the right sternocleidomastoid muscle in the right lateral neck, adjacent surgical clips corresponds with hypermetabolic tumor on recent PET CT, size and configuration not significantly changed. He had some lymph nodes at the level 2B to 3B. He had left greater than right thyromegaly at up to 3.4 cm thyroid nodules. He had a CT of the head with and without contrast, this showing a 14 mm right frontal meningioma which was stable. On CT of the chest with contrast, he showed some mild COPD changes, patchy posterior and inferior right lower lobe  ground-glass airspace opacity, possible atelectasis versus early bronchopneumonia. He shows evidence of coronary artery disease as well.   ASSESSMENT:  Metastatic squamous cell carcinoma of the head and neck. Concern is for tumor invasion along multiple neural pathways. Recent alteration of ability to swallow. Currently, there are plans to do the esophagogastroduodenoscopy tomorrow by Dr. Candace Cruise. I have discussed this with Dr. Candace Cruise as well as Dr. Nadeen [name] and we will continue with those plans to do his esophagogastroduodenoscopy tomorrow morning. In the interim, it may be necessary if no structural lesion is noted to consider possible placement of a gastrostomy tube. There are plans for him to possibly began chemotherapy depending the patient's preference and results of above. In the interim, I have suggested we hold any meals. We will give him some swabs to keep his mouth moist. Further recommendations to follow.   ____________________________ Wyatt Sails, MD mus:cs D: 06/03/2013 19:32:09 ET T: 06/03/2013 20:09:58 ET JOB#: [id number]  cc: Wyatt Sails, MD, <Dictator> Janalee Dane, MD Lupita Dawn. Candace Cruise, MD Wyatt Sails MD ELECTRONICALLY SIGNED 06/16/2013 13:30

## 2014-07-10 NOTE — H&P (Signed)
PATIENT NAME:  Wyatt [name], Wyatt [name] MR#:  [id number] DATE OF BIRTH:  19-Mar-1931  DATE OF ADMISSION:  06/03/2013  REASON FOR ADMISSION: Dysphagia.   REFERRING PHYSICIAN: Henryk H. Jasmine December, MD  ENT:  Janalee Dane, MD  HEMATOLOGY-ONCOLOGY: Kathlene November. Grayland Ormond, MD  GASTROENTEROLOGIST: Scheduled for procedure, Lupita Dawn. Oh, MD  HISTORY OF PRESENT ILLNESS:  An 81 year old gentleman with history of head and neck cancer that has been evaluated in the past by Dr. Carlis Abbott. I had a long discussion with Dr. Carlis Abbott and about his care ; the patient comes in today with a history of being scheduled to have an upper endoscopy tomorrow secondary to significant dysphagia that has been going on for several weeks. The patient has been seen by Dr. Carlis Abbott for this issue and referred to Dr. Candace Cruise.  Dr. Candace Cruise has scheduled an upper endoscopy again for possible dilation tomorrow but the patient was not able to eat or drink anything at home for what  he decided to come into the Emergency Department today. The patient had a modified barium evaluation done last week that shows decreased lip control due to his facial palsy, aspiration with thin liquids and pureed liquids, decreased anterior movement, decreased laryngeal excursion, laryngeal penetration and aspiration, with an area that looks like a stricture; unknown if this is motility-related or mass-related. The patient is admitted for procedure tomorrow. Whenever I spoke with Dr. Nadeen Landau, he gave me the rundown of the story. He was diagnosed with metastatic skin cancer. At the beginning, he had submandibular area that was affected. Needle biopsy of nodules have been done and it was negative up until Dr. Carlis Abbott did a more deeper biopsy. He was able to obtain a sample that showed a squamous cell carcinoma. The patient was given radiation therapy, 25 treatments. Right close to the end of the radiation therapy, the patient develops VIIth nerve palsy with negative MRI, negative CT scan,  negative PET scan. He also received a cervical spinal injection of steroids due to osteoarthritis at around the same time but that was a posterior approach for what this was not related to the nerve palsy. After a while, he had another MRI showing enhancement at the level of the Vth and VIIth cranial nerves and started having significant weight loss, unable to eat or swallow, for what the swallow study was done last Friday. Apparently, there is a 4 to 6 cm sphincter malfunction or stricture.   REVIEW OF SYSTEMS: A 12-system  review of systems is done. CONSTITUTIONAL: Denies any fever, fatigue or weakness.  EYES: No blurry vision, double vision.  EARS, NOSE, THROAT: No tinnitus or ear pain.  RESPIRATORY: No cough. No wheezing. No hemoptysis. No COPD.  CARDIOVASCULAR: No chest pain or orthopnea.  GASTROINTESTINAL: No nausea or vomiting. Positive dysphagia, positive weight loss.  GENITOURINARY: No dysuria, hematuria. Gets some frequency.  ENDOCRINE: No polyuria or polydipsia.  HEMATOLOGIC AND LYMPHATIC: No anemia or easy bruising.  SKIN: No rashes or petechiae.  MUSCULOSKELETAL: Positive chronic neck pain, back pain and osteoarthritis.  NEUROLOGIC: No numbness, tingling. Positive VIIth cranial nerve palsy.  PSYCHIATRIC: No insomnia or depression.   PAST MEDICAL HISTORY: 1.  Squamous cell carcinoma of the skin, metastatic to his neck and head.  2.  Bell's palsy.  3.  Osteoarthritis.  4.  Dysphagia.  5.  Hypertension.  6.  Beginnings of dementia.  7.  Kidney stones.  8.  Depression.   PAST SURGICAL HISTORY:  Back surgery 35 years ago.  ALLERGIES: Not known drug allergies.   FAMILY HISTORY: Negative for cancer. Positive for MI in his mother. His father died in the day 25s due to old age.   SOCIAL HISTORY: The patient used to smoke, he quit 35 years ago. He drinks once every 7 days, 1 small amount of alcohol, 3 ounces. He is retired from [employer].   CURRENT  MEDICATIONS: Vytorin 10/20 mg once a day, Viagra 100 mg as needed, VESIcare 10 mg once a day,  75 mg twice daily, Toprol 25 mg daily, Systane ophthalmic drops for right eye, Norco 325/5 mg 3 times a day, Namenda 10 mg twice daily, Keppra 500 mg twice daily, K-Phos 2 tablets once a day, Jalyn 0.5 mg/4 mg once a day, gabapentin 300 mg take 2 capsules 3 times a day, Cozaar 100 mg once a day, Aricept 10 mg once a day, AcipHex 20 mg 2 times a day.   PHYSICAL EXAMINATION: VITAL SIGNS: Blood pressure 179/92, pulse 79, respirations 18, temperature 97.6, oxygen saturation 97% on room air.  GENERAL: Alert and oriented x 3, in no acute distress. No respiratory distress. Hemodynamically stable.  HEENT: Pupils are equal and reactive. Extraocular movements are intact. Positive right-sided facial palsy which is complete with inability of close the eye, positive eye lacrimation. Tongue is central. Uvula is central.  NECK: Supple. No JVD. No thyromegaly. Positive adenopathy at the level of the right anterior and lateral areas of the neck which is nontender. It is about marble-sized adenopathy, hard but mobile. The patient's neck is mobile with full range of motion.  CARDIOVASCULAR: Regular rate and rhythm. No murmurs, rubs or gallops are appreciated. No displacement of PMI.  LUNGS: Clear without any wheezing or crepitus. No use of accessory muscles.  ABDOMEN: Soft, nontender, nondistended. No hepatosplenomegaly. No masses. Bowel sounds. Positive.  GENITAL: Deferred.  EXTREMITIES: No edema, cyanosis or clubbing. Pulses +2. Capillary refill less than 3.  MUSCULOSKELETAL: No joint effusions or swelling.  LYMPHATIC: Positive lymphadenopathy in the neck. None in supraclavicular areas, no axillary.  SKIN: No rashes or petechiae.   LABORATORY, DIAGNOSTIC AND RADIOLOGICAL DATA:  Previous results mentioned above. LABS: Potassium 3.4, other electrolytes within normal limits. Alkaline phosphatase 129. CBC shows white count 8.6,  hemoglobin 15, platelet count 257.   ASSESSMENT AND PLAN: A very nice 81 year old gentleman with history of head and neck cancer, comes today with dysphagia, is scheduled for procedure tomorrow.  1.  Dysphagia. The patient has significant issues, still unknown the reason of the difficulty swallowing but the patient had significant weight loss of over 50 pounds within the past couple of months. The patient has neck cancer, which is likely contributing to this. Unknown if this is a functional process secondary to nerve damage as the patient has invasive cancer to the Vth and VIIth nerves.  He could have some laryngeal recurrent or any other neck nerves being affected. Unknown if this is affecting the brachial plexus as the patient has difficulty raising the arm although he has been told before and MRIs have been done before around this area and it seems to be more like an osteoarthritis with impingement. As far as swallowing goes, again unknown if this is a stricture which is anatomical from either scarring of the esophagus or a mass protruding into the esophagus. I discussed the case with Dr. Carlis Abbott and he is in agreement that there is not enough information for what he requested a CT of the head, neck and  chest to evaluate diffusion of the cancer. At this moment, we are going to keep the patient n.p.o., gastroenterology consulted for tomorrow. Keep him on IV fluids, consider the possibility of nasogastric tube for feedings. For now, avoid any pills. The patient is a full liquid diet, as he has been doing at home.  2.  Hypertension. Elevations of the blood pressure, not able to take his medications. We are going to give him metoprolol IV scheduled and p.r.n. hydralazine. Consider Vasotec IV if necessary.  3.  Dementia.  Seems to be stable. Hold medications.  4.  The patient takes Keppra. He is a poor historian. He could not tell me if he had seizures in the past or not.  We are just going to continue it as is  also for mood.  5.  Hypokalemia. Replace with IV fluids  6.  Other medical problems are stable.  7.  Deep vein thrombosis prophylaxis. The patient might have surgery, for what we are going to do only PLCs.  8.  Gastrointestinal prophylaxis with Protonix.   TIME SPENT: I spent about 60 minutes with this patient, talking with consultant.  The patient is a full code.    ____________________________ [place] Sink, MD rsg:cs D: 06/03/2013 13:45:00 ET T: 06/03/2013 14:06:42 ET JOB#: [id number]  cc: [place] Sink, MD, <Dictator> Gena Laski America Brown MD ELECTRONICALLY SIGNED 06/04/2013 13:39

## 2014-07-10 NOTE — Op Note (Signed)
PATIENT NAME:  Wyatt [name], Wyatt [name] MR#:  [id number] DATE OF BIRTH:  Jul 29, 1930  DATE OF PROCEDURE:  04/23/2013  SURGEON: Janalee Dane, MD.  PREOPERATIVE DIAGNOSES:  1.  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  2.  Right neck possible recurrence of squamous cell carcinoma.   POSTOPERATIVE DIAGNOSES: 1.  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  2.  Right neck possible recurrence of squamous cell carcinoma.   PROCEDURES:  1.  Revision right upper eyelid platinum weight.  2.  Revision right lower eyelid canthoplasty.  3.  Fine needle aspiration, right neck.  4.  Core biopsy, right neck.   DESCRIPTION OF PROCEDURE: The patient was placed in the supine position on the operating room table. After general LMA anesthesia had been induced, the patient was turned 90 degrees counter clockwise from anesthesia. The eye was locally anesthetized with topical proparacaine and 0.5% lidocaine and 0.25% bupivacaine mixed 1:150,000 with epinephrine in the right supratarsal fold extending down in a pretarsal plane to the ciliary margin. The right lateral canthus was injected with a similar local. A corneal shield coated with Lacri-Lube was placed. The fine needle aspiration was carried out with a 22-gauge needle x 2 and an 18-gauge needle x 1. The cyto-technologist was in the room and the specimen was read as adequate and suspicious for malignant neoplasm. After the FNA had been completed, gloves were changed and the eye was prepped and draped in the usual fashion. A #15 blade was used to make an incision in the supratarsal crease and although there was significant scar tissue, a pretarsal plane was developed just large enough to place the 0.8 gram platinum weight. The weight was secured with 6-0 fast absorbing gut. The incision was closed with 6-0 fast absorbing gut.   Attention was directed to the lower eyelid where a lateral canthoplasty and inferior cantholysis was carried out. A  tarsal strip was created by removing skin and palpebral conjunctiva and releasing the scar tissue. The tarsal tendon was then suspended in the medial aspect of the lateral orbital rim using 4-0 PDS suture. The orbicularis was then reapproximated with 5-0 Vicryl suture. The skin was closed with 6-0 fast absorbing gut suture. Ice gauze was placed against the eyelid after the corneal shield had been removed. The patient was then returned to anesthesia, allowed to emerge from anesthesia in the operating room, and taken to the recovery room in stable condition. There were no complications. Estimated blood loss 10 mL.   ____________________________ J. Nadeen Landau, MD [id number] D: 04/23/2013 12:37:18 ET T: 04/23/2013 12:45:57 ET JOB#: [id number]  cc: Janalee Dane, MD, <Dictator> Nicholos Johns MD ELECTRONICALLY SIGNED 05/11/2013 7:38

## 2014-07-10 NOTE — Discharge Summary (Signed)
PATIENT NAME:  Wyatt [name], Wyatt [name] MR#:  [id number] DATE OF BIRTH:  1930-08-02  DATE OF ADMISSION:  06/03/2013 DATE OF DISCHARGE:  06/06/2013  PRIMARY CARE PHYSICIAN: Janalee Dane, MD, ENT.  ONCOLOGIST: Kathlene November. Grayland Ormond, MD  FINAL DIAGNOSES: 1. Dysphagia and dehydration.  2. History of throat cancer and cranial nerve 7 nerve palsy.  3. Hypertension.  4. History of seizure.  5. Malnutrition.   OPERATION DURING THE HOSPITAL COURSE: Was a PEG procedure.   MEDICATIONS ON DISCHARGE: Include Systane ophthalmic solution 1 drop each eye twice a day, Viagra 100 milligrams PEG once a day as needed, Norco 5/325 one to 2 tablets PEG 3 times a day as needed for pain, losartan 100 milligrams PEG once a day, Jaylyn 1 capsule PEG in the morning, gabapentin 300 milligrams 2 capsules PEG 3 times a day, Keppra 500 milligrams PEG twice a day, venlafaxine 75 milligrams 1 tablet PEG twice a day, Vytorin 10/20 one capsule PEG at bedtime, metoprolol tartrate 25 milligrams PEG twice a day, Aricept 10 milligrams PEG once a day, Namenda 10 milligrams PEG twice a day, Aciphex 20 milligrams 1 tablet PEG twice a day, Vesicare 10 milligrams 1 tablet PEG daily, Jevity 1.5 calorie bolus feed 1-1/2 cans PEGs at 8:00 a.m., 12 noon, 4:00 p.m. and 8:00 p.m.; flush which free water 50 milliliters before and after meals.   HOME HEALTH: Yes.   PHYSICAL THERAPY: Nurse and nurse aide.   TREATMENTS: Free water via PEG 260 milliliters twice a day.   HOME OXYGEN: No.   DIET: His ice chips only, otherwise no food by mouth.   ACTIVITY: As tolerated.   REFERRAL: With your oncologist, Dr. Grayland Ormond and 1 to 2 weeks with Dr. Nadeen Landau.   HOSPITAL COURSE: The patient was admitted 06/03/2013 and discharged 06/06/2013. Seen in consultation by Dr. Nadeen Landau, initially Dr. Gustavo Lah and then Dr. Candace Cruise did a PEG procedure on 06/05/2013. The patient presented with dysphagia, was started on IV fluids and kept nothing by mouth    [place]: Included a glucose of 99, BUN 18, creatinine 0.84, sodium 139, potassium 3.4, chloride 104, CO2 of 32, calcium 9.6. Liver function tests: Alkaline phosphatase elevated at 129, ALT 33, AST 37. White blood cell count 8.6, hemoglobin and hematocrit 15.1 and 44.6, platelet count of 257,000.   Chest x-ray: Mild hyperinflation consistent with COPD, atelectasis versus scarring at the lung bases and in the apices. CT scan of the head with and without contrast showed 14 millimeter right frontal meningioma, stable. No superimposed acute abnormality. CT scan of the chest with contrast showed patchy posterior and inferior right lower lobe ground glass airspace opacity, I suspect it represents atelectasis. CT neck with contrast showed unusual superficial soft tissue tumor inseparable from the right sternal muscle and the right lateral neck. Corresponds to the hypermetabolic tumor on the recent PET CT, size and configuration not significantly changed. Left greater than right, thyromegaly, up to 3.4 centimeter thyroid nodules.   HOSPITAL COURSE PER PROBLEM LIST:  1. For the dysphagia and dehydration, the patient was given IV fluid during the hospital course. PEG placement was done by Dr. Candace Cruise. The patient will be hydrated through the PEG and nutrition via the PEG, nothing by mouth  as per modified barium swallow, did not pass this test. He will be ice chips only, nothing by mouth otherwise. All nutrition and medications via the PEG.  Family taught PEG maintenance and medications and water flushing.  The patient will be increased up to 1-1/2 cans of Jevity 1.5 four times a day with free water flushes of 260 milliliters twice a day.  2. History of throat cancer and cranial nerve palsy. Follow-up with ENT and oncology as outpatient for further management.  3. Hypertension. I did switch the metoprolol to 25 milligrams twice a day. Blood pressure was elevated while  here. This is easier to crush via the PEG.  4. History of seizure, on Keppra.  5. Malnutrition secondary to dysphagia. Hopefully the PEG tube feedings will elevate nutritional status.   TIME SPENT ON DISCHARGE: Forty minutes.    ____________________________ Tana Conch. Leslye Peer, MD [id number] D: 06/06/2013 14:57:32 ET T: 06/06/2013 21:10:18 ET JOB#: [id number]  cc: Tana Conch. Leslye Peer, MD, <Dictator> Janalee Dane, MD Kathlene November. Grayland Ormond, MD Marisue Brooklyn MD ELECTRONICALLY SIGNED 06/12/2013 16:27

## 2014-07-10 NOTE — Consult Note (Signed)
20 Fr G tube successfully placed. Insertion site at 3.5 cm. Can use G tube for flushings and meds today. If stable, can start TF tomorrow. Have nutritionist go over bolus feedings with patient. Nursing to teach G tube care, regular flushings, etc. Thanks.  Electronic Signatures: Verdie Shire (MD)  (Signed on 20-Mar-15 12:07)  Authored  Last Updated: 20-Mar-15 12:07 by Verdie Shire (MD)

## 2014-07-10 NOTE — Consult Note (Signed)
EGD with dilatiion performed today. No stricture present. Doubtful dilation would help. Agree with speech path evaluation tomorrow. Will likely need feeding tube soon unless there is dramatic change in pt's swallowing. Thanks.  Electronic Signatures: Verdie Shire (MD)  (Signed on 19-Mar-15 10:23)  Authored  Last Updated: 19-Mar-15 10:23 by Verdie Shire (MD)

## 2014-07-10 NOTE — Consult Note (Signed)
Brief Consult Note: Diagnosis: dysphagia.   Patient was seen by consultant.   Consult note dictated.   Recommend to proceed with surgery or procedure.   Recommend further assessment or treatment.   Discussed with Attending MD.   Comments: Please see full GI consult [phone].  Wyatt [name] admitted with progressive dysphagia over several weeks.  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  Seen by Dr Candace Cruise in o/p clinic yesterday with arrangements for egd today for further evaluation.  Will plan for egd tomorrow am, npo for now.  Discussed with Dr Candace Cruise and Dr Carlis Abbott and Dr Danise Mina.  Electronic Signatures: Loistine Simas (MD)  (Signed 18-Mar-15 19:43)  Authored: Brief Consult Note   Last Updated: 18-Mar-15 19:43 by Loistine Simas (MD)

## 2014-09-20 IMAGING — CT CT CHEST W/ CM
2 of 4 series · 15 of 36 positions shown, 18 images · IV contrast (isovue)
Comparison: None.

CLINICAL DATA: Right facial drooping.  Dysphagia.

EXAM:
CT CHEST WITH CONTRAST
TECHNIQUE: Multidetector CT imaging of the chest was performed during
intravenous contrast administration.
CONTRAST:  100 cc Isovue 370

[Series 3: cap thins · axial · 0.62mm/px · z∈[-50,+271]mm · 12 of 238 slices shown, 15 images]
[im 12/238  mediastinal]
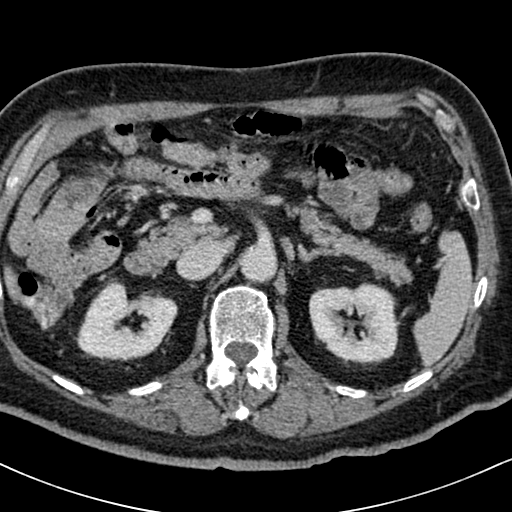
[im 12/238  lung]
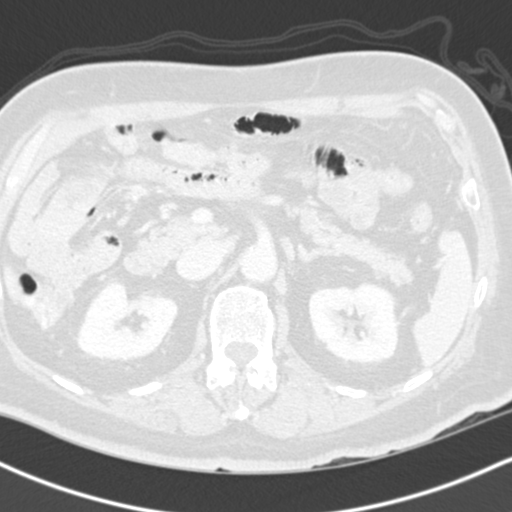
[im 34/238  lung]
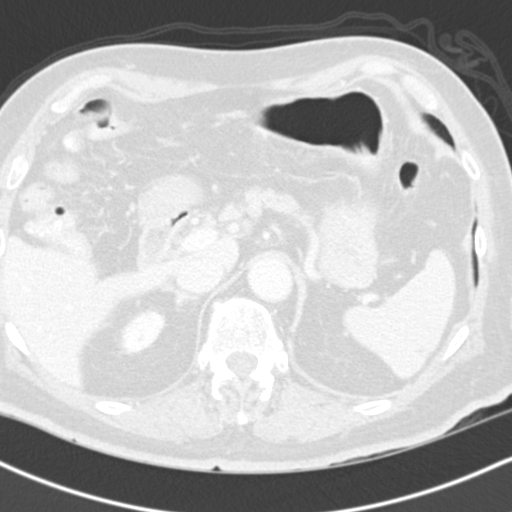
[im 57/238  lung]
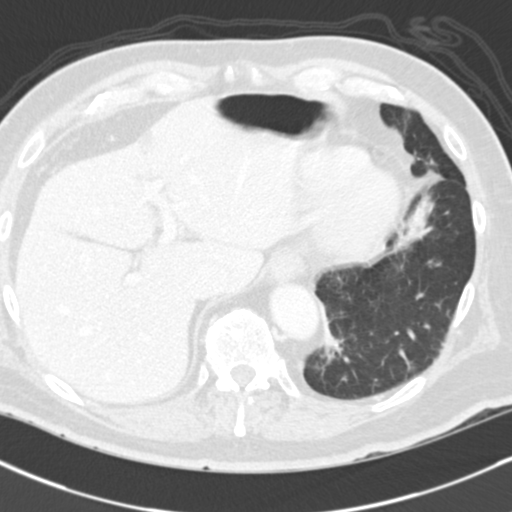
[im 68/238  lung]
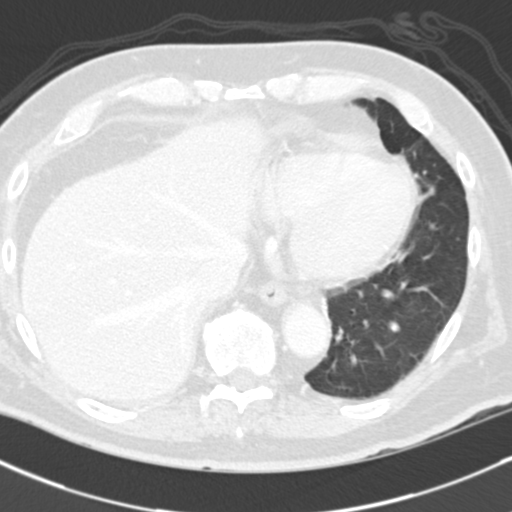
[im 91/238  mediastinal]
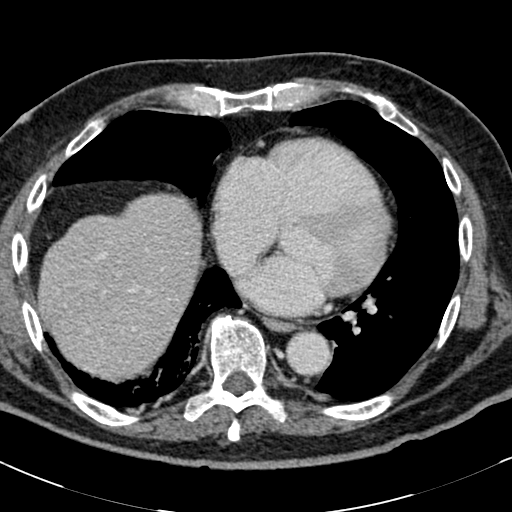
[im 91/238  lung]
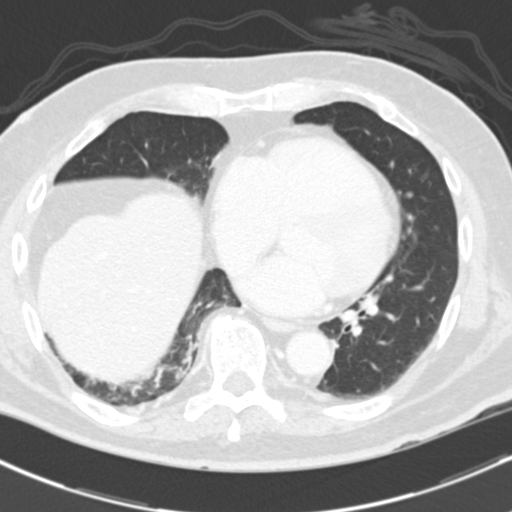
[im 113/238  lung]
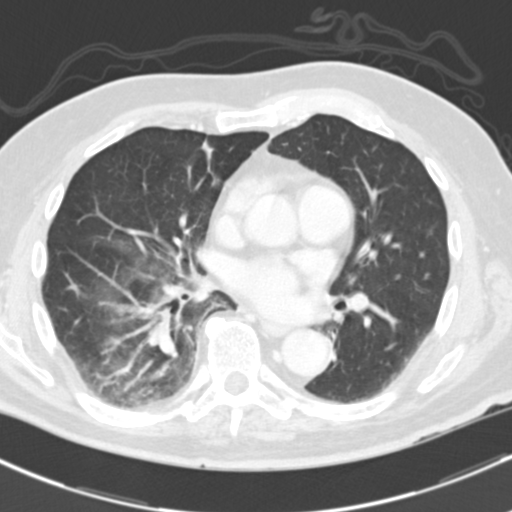
[im 125/238  lung]
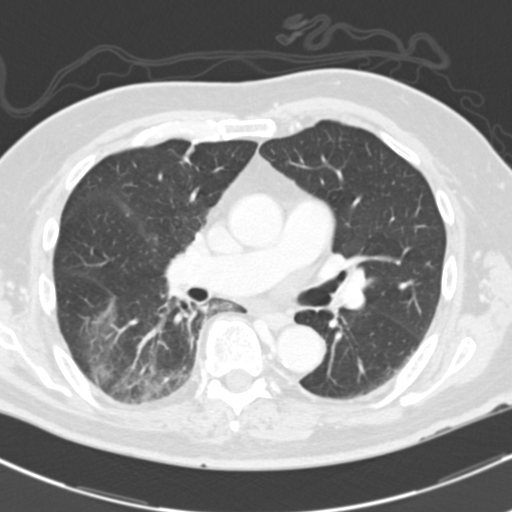
[im 147/238  lung]
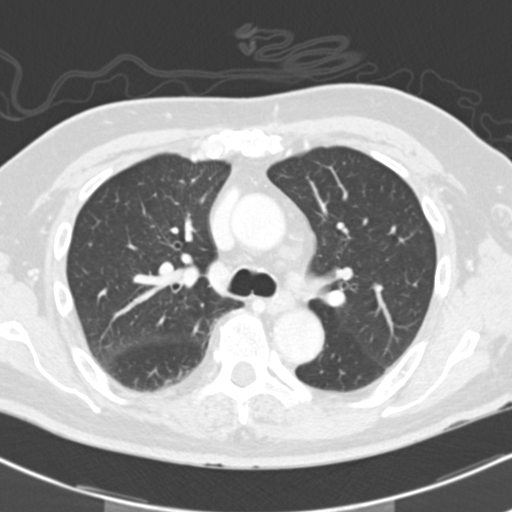
[im 170/238  mediastinal]
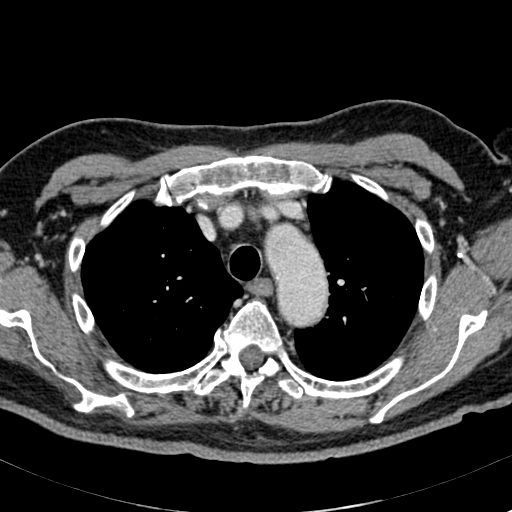
[im 170/238  lung]
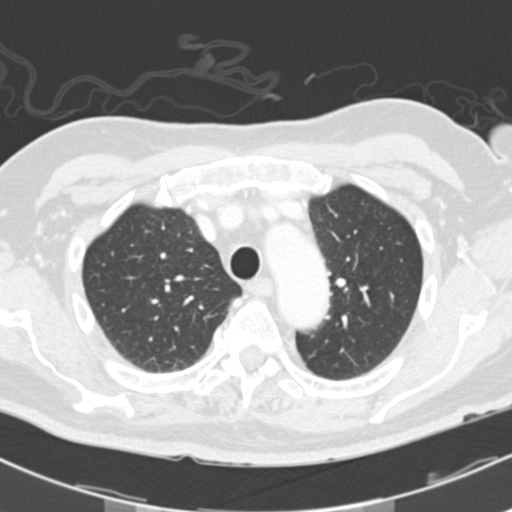
[im 181/238  lung]
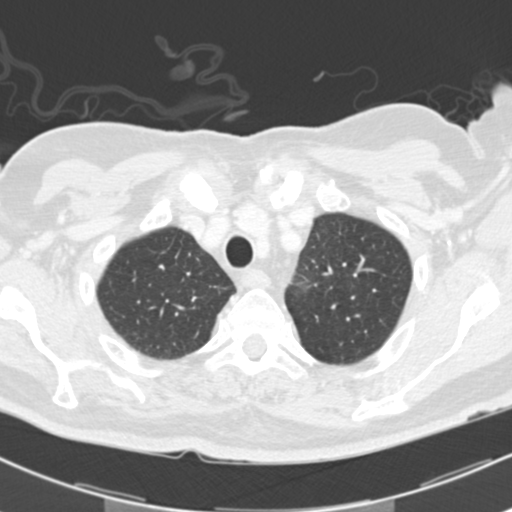
[im 204/238  lung]
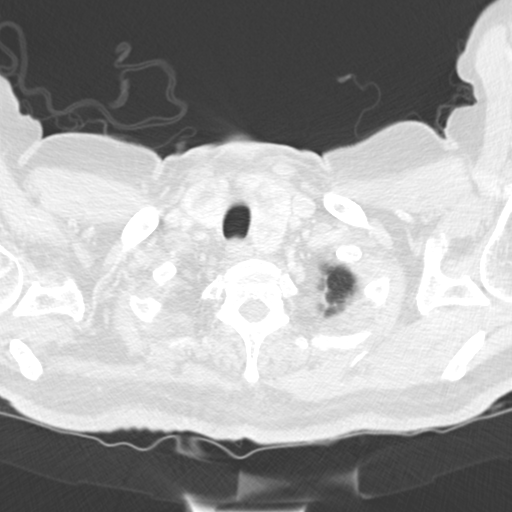
[im 226/238  lung]
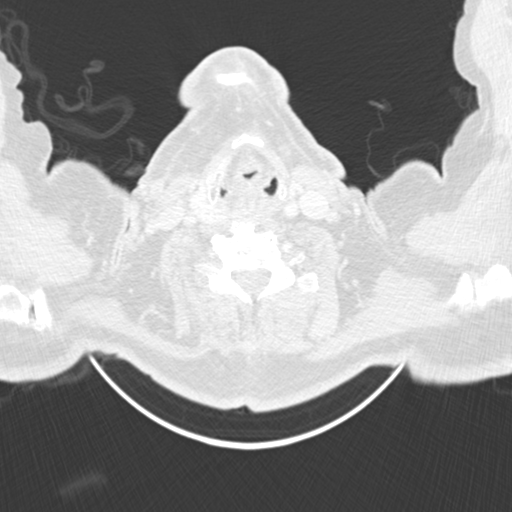

[Series 11: cor cap with cor cor cor · coronal · 0.67mm/px · 3 of 123 slices shown]
[im 25/123  lung]
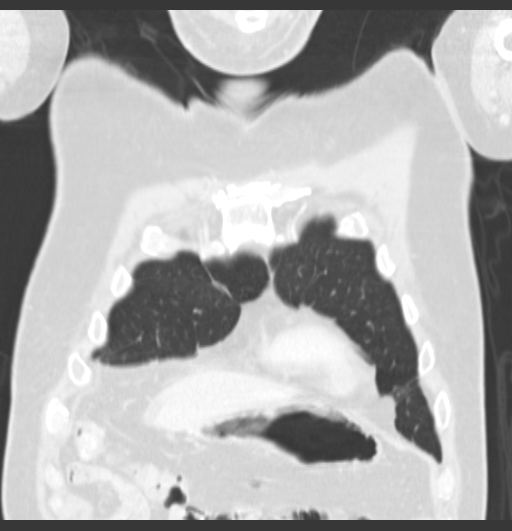
[im 49/123  lung]
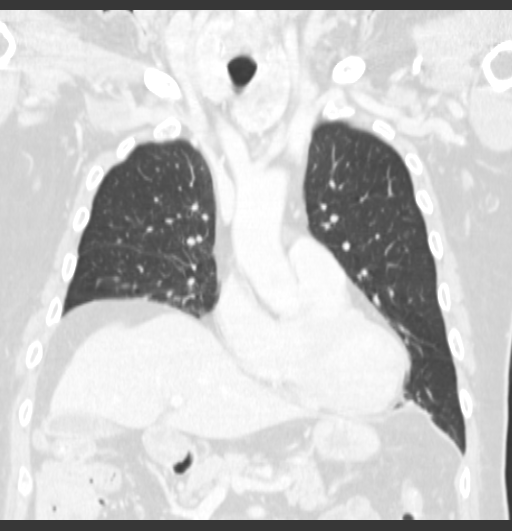
[im 74/123  lung]
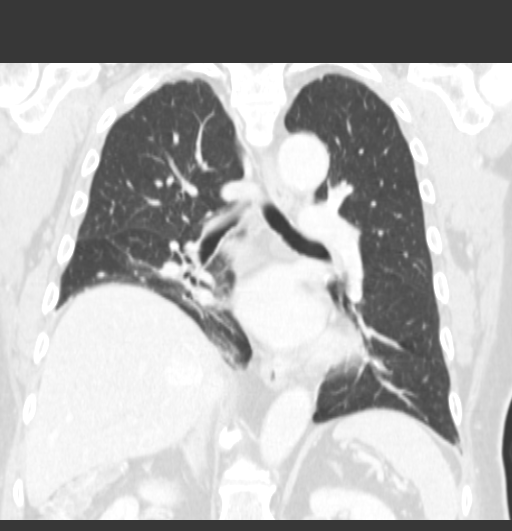

[15 of 36 positions shown; findings below may reference images not displayed]

FINDINGS: Patchy posterior and inferior ground-glass opacities within the
right lower lobe. This likely reflects atelectasis although
pneumonia/bronchopneumonia cannot be completely excluded. Recommend
clinical correlation. No confluent opacity on the left. No
effusions. No suspicious pulmonary nodules.

Mild COPD. Coronary artery calcifications are noted. Heart is normal
size. Aorta is normal caliber. No mediastinal, hilar, or axillary
adenopathy.

Multiple bilateral low-density thyroid nodules are noted, the
largest on the left measuring up to 2.0 cm. Enlargement of the left
thyroid lobe causes mild impression and displacement of the trachea
to the right. Chest wall soft tissues are unremarkable. Imaging into
the upper abdomen shows no acute findings.

No acute bony abnormality.
IMPRESSION: Patchy posterior and inferior right lower lobe ground-glass airspace
opacity. I suspect this represents atelectasis although early
bronchopneumonia cannot be excluded.

Coronary artery disease.

COPD.
